# Patient Record
Sex: Female | Born: 1985 | Race: White | Hispanic: No | Marital: Single | Smoking: Current every day smoker
Health system: Southern US, Community
[De-identification: ages and names within clinical notes are randomized; demographics above are authoritative.]

## PROBLEM LIST (undated history)

## (undated) DIAGNOSIS — Z8489 Family history of other specified conditions: Secondary | ICD-10-CM

## (undated) DIAGNOSIS — N939 Abnormal uterine and vaginal bleeding, unspecified: Secondary | ICD-10-CM

## (undated) DIAGNOSIS — N920 Excessive and frequent menstruation with regular cycle: Secondary | ICD-10-CM

## (undated) HISTORY — PX: TUBAL LIGATION: SHX77

## (undated) HISTORY — PX: IUD REMOVAL: SHX5392

---

## 2005-01-22 ENCOUNTER — Emergency Department: Payer: Self-pay | Admitting: Emergency Medicine

## 2005-02-23 ENCOUNTER — Emergency Department: Payer: Self-pay | Admitting: Emergency Medicine

## 2005-12-14 ENCOUNTER — Inpatient Hospital Stay: Payer: Self-pay | Admitting: Unknown Physician Specialty

## 2006-10-18 ENCOUNTER — Emergency Department: Payer: Self-pay | Admitting: Emergency Medicine

## 2009-03-02 ENCOUNTER — Inpatient Hospital Stay: Payer: Self-pay | Admitting: Internal Medicine

## 2011-05-07 ENCOUNTER — Emergency Department: Payer: Self-pay | Admitting: Internal Medicine

## 2011-06-06 ENCOUNTER — Emergency Department: Payer: Self-pay | Admitting: Emergency Medicine

## 2013-04-16 IMAGING — CT CT HEAD WITHOUT CONTRAST
1 series · 16 of 30 positions shown, 20 images · non-contrast
Comparison: none

REASON FOR EXAM: ha
COMMENTS:

PROCEDURE:     CT  - CT HEAD WITHOUT CONTRAST  - May 07, 2011 [DATE]
RESULT:     Comparison:  None
TECHNIQUE: Multiple axial images from the foramen magnum to the vertex were
obtained without IV contrast.

[Series 2: soft tissue · axial · 0.44mm/px · z∈[-152,-17]mm · 16 of 31 slices shown, 20 images]
[im 2/31  brain]
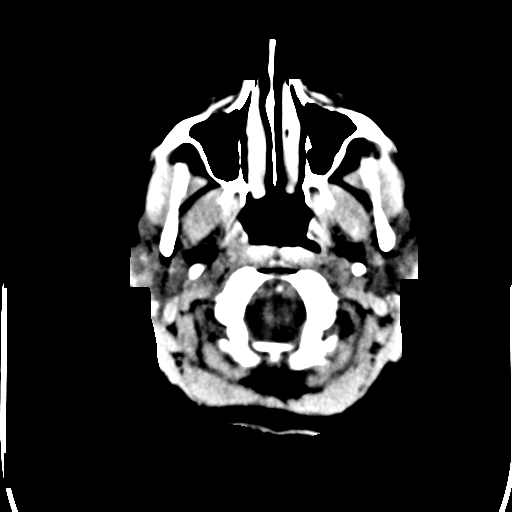
[im 2/31  bone]
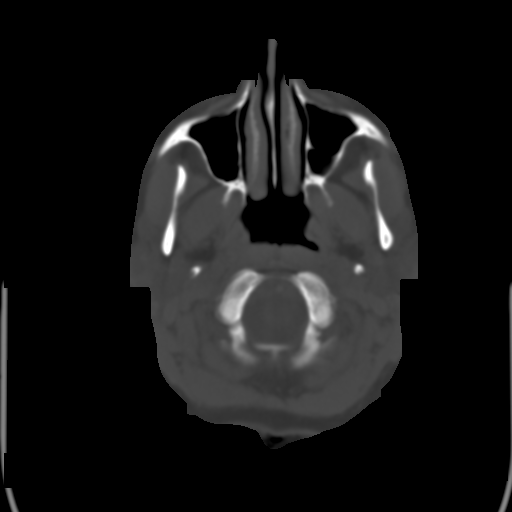
[im 4/31  brain]
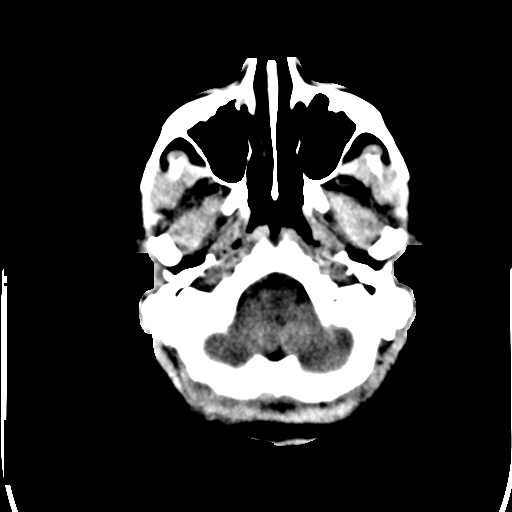
[im 6/31  brain]
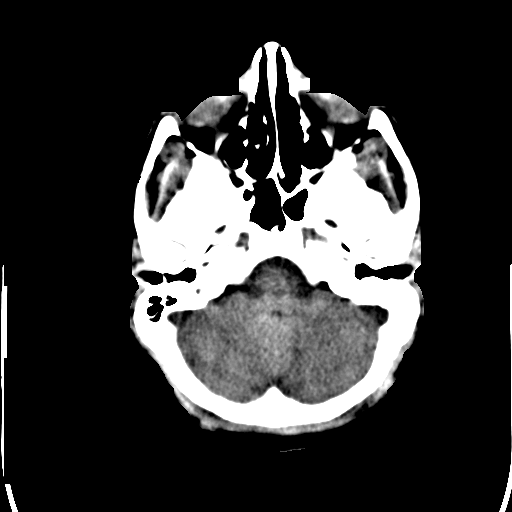
[im 8/31  brain]
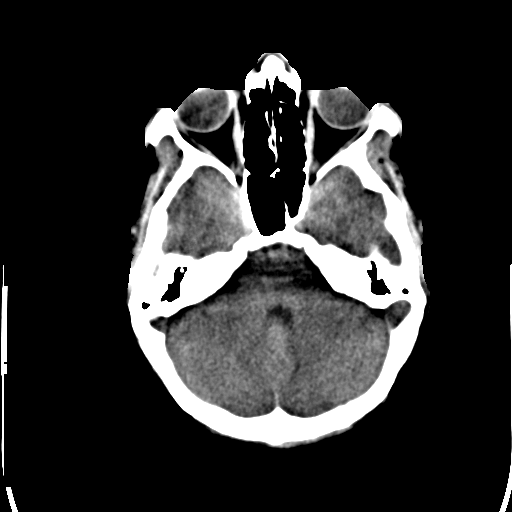
[im 9/31  brain]
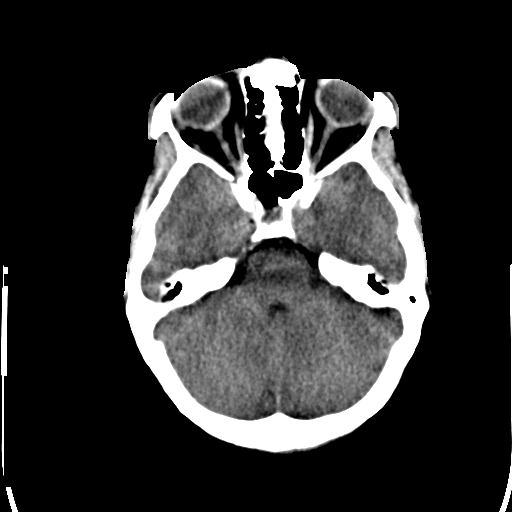
[im 9/31  bone]
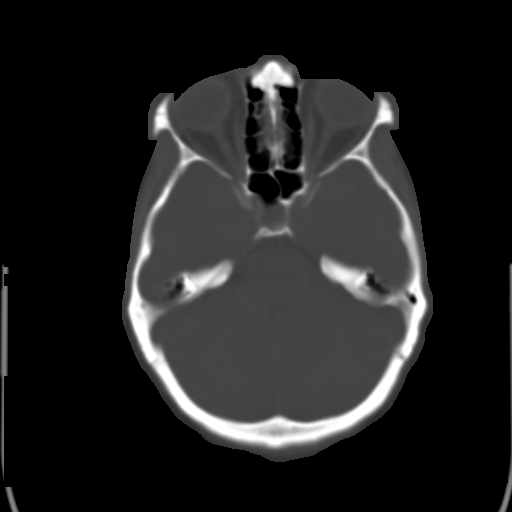
[im 11/31  brain]
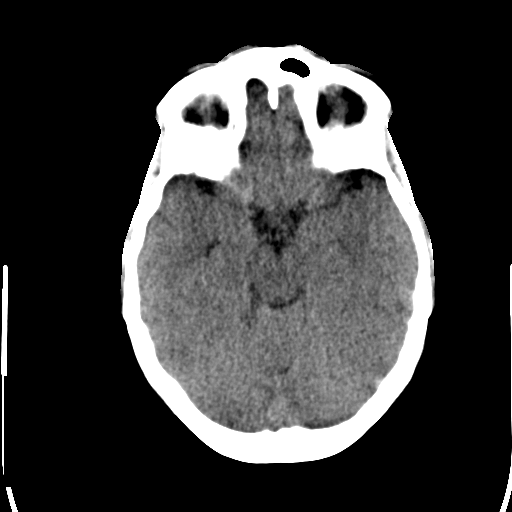
[im 13/31  brain]
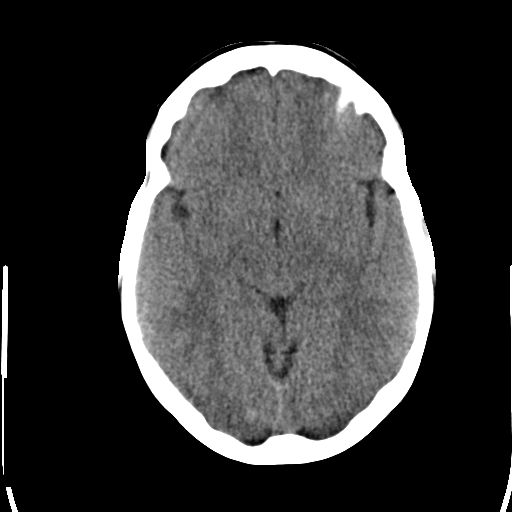
[im 15/31  brain]
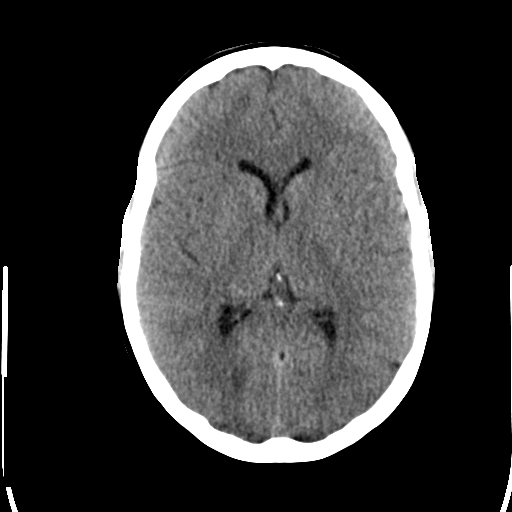
[im 16/31  brain]
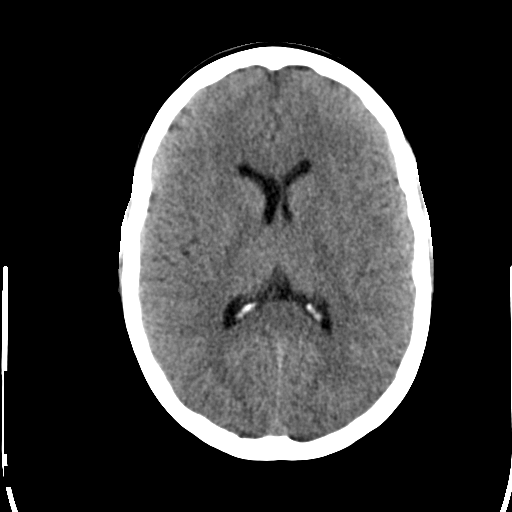
[im 16/31  bone]
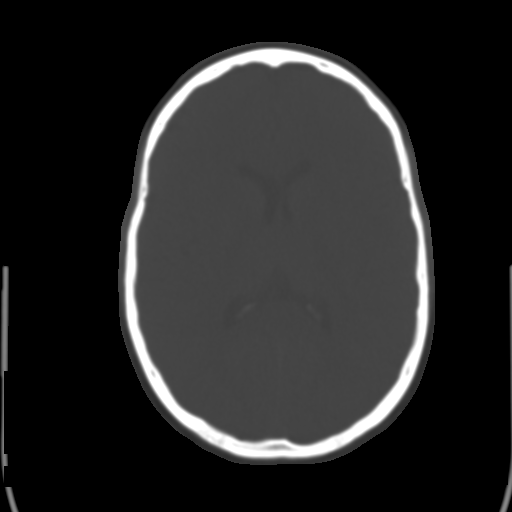
[im 18/31  brain]
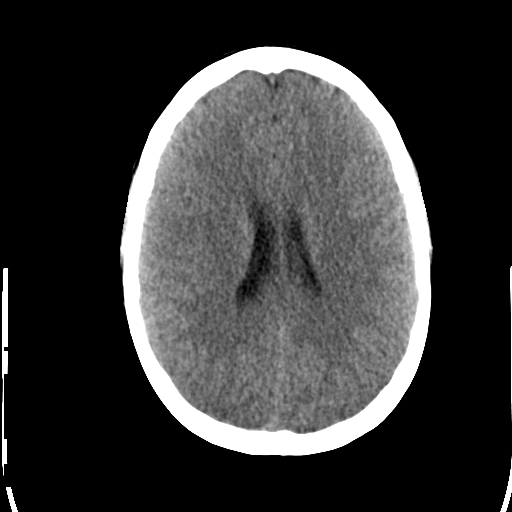
[im 20/31  brain]
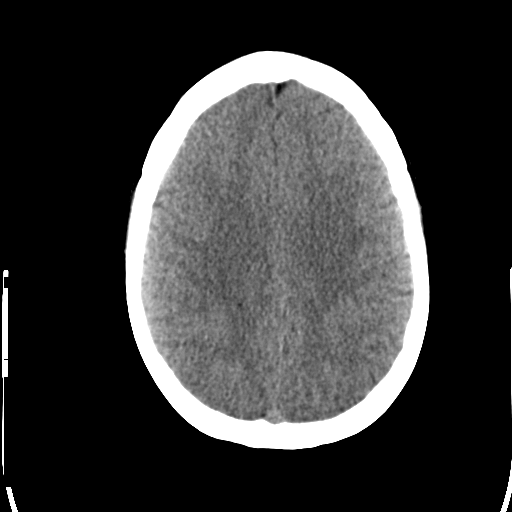
[im 22/31  brain]
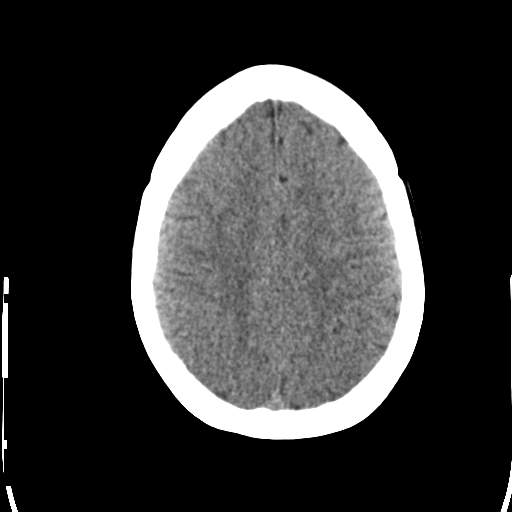
[im 23/31  brain]
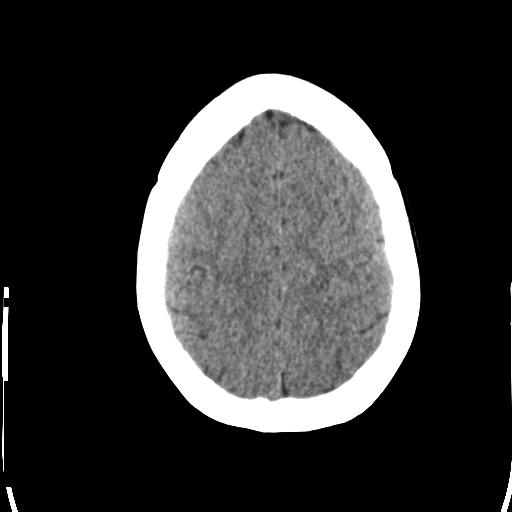
[im 23/31  bone]
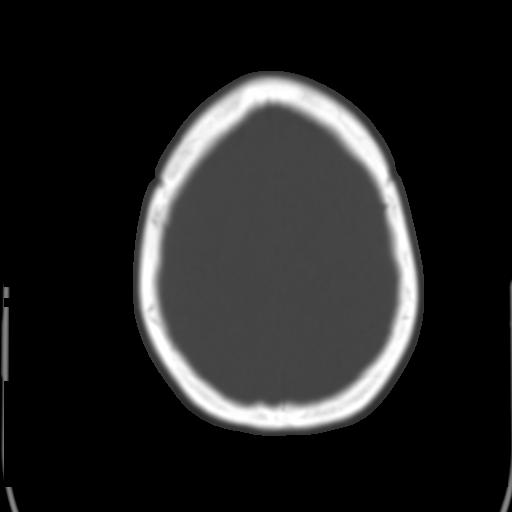
[im 25/31  brain]
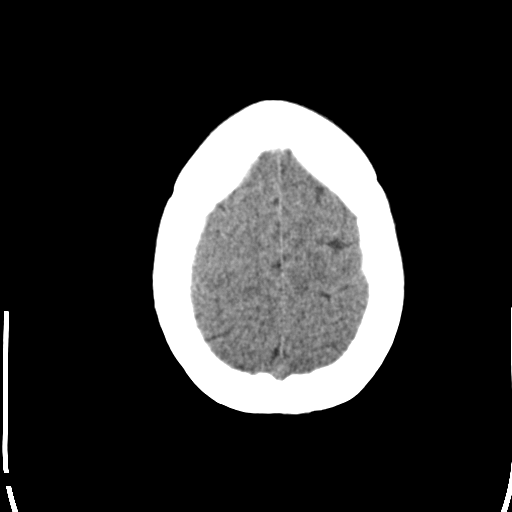
[im 27/31  brain]
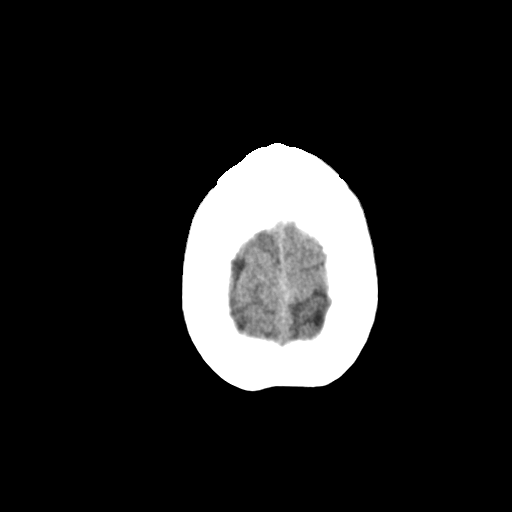
[im 29/31  brain]
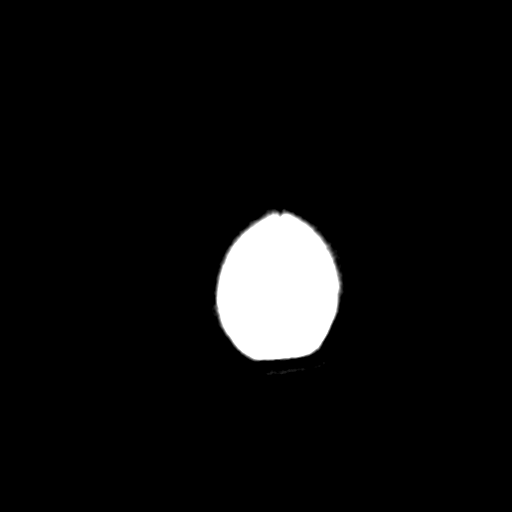

[16 of 30 positions shown; findings below may reference images not displayed]

FINDINGS: There is no evidence of mass effect, midline shift, or extra-axial fluid
collections.  There is no evidence of a space-occupying lesion or
intracranial hemorrhage. There is no evidence of a cortical-based area of
acute infarction.

The ventricles and sulci are appropriate for the patient's age. The basal
cisterns are patent.

Visualized portions of the orbits are unremarkable. The visualized portions
of the paranasal sinuses and mastoid air cells are unremarkable.

The osseous structures are unremarkable.
IMPRESSION: No acute intracranial process.

## 2014-05-09 ENCOUNTER — Emergency Department: Payer: Self-pay | Admitting: Student

## 2015-08-08 HISTORY — PX: INTRAUTERINE DEVICE (IUD) INSERTION: SHX5877

## 2015-09-25 ENCOUNTER — Encounter
Admission: RE | Admit: 2015-09-25 | Discharge: 2015-09-25 | Disposition: A | Discharge status: Home / Self Care | Payer: Managed Care, Other (non HMO) | Source: Ambulatory Visit | Attending: Obstetrics and Gynecology | Admitting: Obstetrics and Gynecology

## 2015-09-25 DIAGNOSIS — Z01812 Encounter for preprocedural laboratory examination: Secondary | ICD-10-CM | POA: Insufficient documentation

## 2015-09-25 HISTORY — DX: Family history of other specified conditions: Z84.89

## 2015-09-25 HISTORY — DX: Abnormal uterine and vaginal bleeding, unspecified: N93.9

## 2015-09-25 HISTORY — DX: Excessive and frequent menstruation with regular cycle: N92.0

## 2015-09-25 LAB — BASIC METABOLIC PANEL
Anion gap: 8 (ref 5–15)
BUN: 13 mg/dL (ref 6–20)
CHLORIDE: 103 mmol/L (ref 101–111)
CO2: 26 mmol/L (ref 22–32)
CREATININE: 0.74 mg/dL (ref 0.44–1.00)
Calcium: 9.3 mg/dL (ref 8.9–10.3)
GFR calc Af Amer: >60 mL/min (ref >60)
GFR calc non Af Amer: >60 mL/min (ref >60)
GLUCOSE: 82 mg/dL (ref 65–99)
Potassium: 3.6 mmol/L (ref 3.5–5.1)
SODIUM: 137 mmol/L (ref 135–145)

## 2015-09-25 LAB — CBC
HCT: 36.4 % (ref 35.0–47.0)
Hemoglobin: 12.2 g/dL (ref 12.0–16.0)
MCH: 27.5 pg (ref 26.0–34.0)
MCHC: 33.5 g/dL (ref 32.0–36.0)
MCV: 82.1 fL (ref 80.0–100.0)
PLATELETS: 342 10*3/uL (ref 150–440)
RBC: 4.43 MIL/uL (ref 3.80–5.20)
RDW: 15.3 % — AB (ref 11.5–14.5)
WBC: 9.5 10*3/uL (ref 3.6–11.0)

## 2015-09-25 LAB — TYPE AND SCREEN
ABO/RH(D): A POS
Antibody Screen: NEGATIVE

## 2015-09-25 LAB — ABO/RH: ABO/RH(D): A POS

## 2015-09-25 NOTE — H&P (Signed)
Ms. Krogh is a 30 y.o. female here for Pre Op Consulting .  AUB- heavy menometrorrhagia since menarche, worsening recently, currently 1 tampon q2-3hrs for months.  We have tried: OCPs from age [age] and cycles became regular with Menorrhagia. Subsquently has tried Nuvaring and the patch for bleeding without relief. S/p BTL. I trialed Mirena IUD without improvement in bleeding.   Hx of known fibroids, +easy bruising - von willebrand's disease negative, normal PT/INR PTT, normal TSH, normal CBC, normal prolactin (mildly high at 25 and no repeat yet).  Acute pelvic pain: Increased in last several months and today has reached a crescendo that caused her to leave work early. Usually on the left side and described as cramping, sharp, terrible. Today in midline but deep in pelvis. Pain not resolved with IUD.  Ultrasound 07/10/15: Ut wnl, but endometrial thickness is significant at 17.64mm bil ovs wnl- both enlarged with R volulme of 12.6cm3 and left volume of 13.2cm3.  Free fluid seen ant cul de sac  Hx of ovarian cancer in her maternal grandmother, mother, and sister.   Past Medical History:  has a past medical history of Dysmenorrhea; High risk HPV infection; Menorrhagia; and Unspecified dyspareunia.  Past Surgical History:  has a past surgical history that includes Tubal ligation. Family History: family history includes Cervical cancer in her maternal grandmother; No Known Problems in her father; Ovarian cancer in her maternal grandmother, mother, and sister. She was adopted. Social History:  reports that she has been smoking. She has a 7.50 pack-year smoking history. She has never used smokeless tobacco. She reports that she does not drink alcohol or use illicit drugs. OB/GYN History:  OB History    Gravida Para Term Preterm AB TAB SAB Ectopic Multiple Living   Allergies: is allergic to penicillin. Medications:  Current Outpatient Prescriptions:  .  ibuprofen (ADVIL,MOTRIN) 800 MG tablet, Take 1 tablet (800 mg total) by mouth every 8 (eight) hours as needed for Pain., Disp: 60 tablet, Rfl: 1 . norethindrone-ethinyl estradiol (ORTHO-NOVUM, NORTREL) 1-35 mg-mcg tablet, Take 1 tab q6hrs for 4 days, then q8 hours for 3 days, then q12 hrs for 2 days, then continue regular pack (1 pill daily)., Disp: 1 Package, Rfl: 6 . oxyCODONE-acetaminophen (PERCOCET) 5-325 mg tablet, Take 1 tablet by mouth every 6 (six) hours as needed for Pain., Disp: 30 tablet, Rfl: 0   Review of Systems: General:   No fatigue or weight loss Eyes:   No vision changes Ears:   No hearing difficulty Respiratory:   No cough or shortness of breath Pulmonary:   No asthma or shortness of breath Cardiovascular:  No chest pain, palpitations, dyspnea on exertion Gastrointestinal:  No abdominal bloating, chronic diarrhea, constipations, masses, pain or hematochezia Genitourinary:  No hematuria, dysuria, abnormal vaginal discharge, + pelvic pain, + Menometrorrhagia  Lymphatic:  No swollen lymph nodes Musculoskeletal: No muscle weakness Neurologic:  No extremity weakness, syncope, seizure disorder Psychiatric:  No history of depression, delusions or suicidal/homicidal ideation   Exam:      Vitals:   09/24/15 1557  BP: 133/86  Pulse: 109   Body mass index is 23.1 kg/(m^2).  WDWN white female in NAD Lungs: CTA  CV : RRR without murmur  Breast: deferred Neck: no thyromegaly Abdomen: soft , no mass, normal active bowel sounds, non-tender, no rebound tenderness Skin: No rashes, ulcers or skin lesions noted. No excessive hirsutism or acne noted.  Neurological: Appears alert and oriented and is a good historian. No gross abnormalities are noted. Psychological: Normal affect and mood. No signs of anxiety or depression noted.   Pelvic:  Deferred    Impression:   The primary encounter diagnosis was Abnormal uterine bleeding (AUB). Diagnoses of  Menometrorrhagia and Pelvic pain in female were also pertinent to this visit.    Plan:   - Acute on pelvic pain: Pain is significantly limiting. Temporarily manage pain with Percocet #30 tabs given. Continue  motrin. Smoker. ?adenomyosis. Will need genetic consult if proceed with hysterectomy.  -  AUB- unresolved with multiple methods of hormone treatment. Tissue sample required. EMBx not performed as will both cause her pain and not change my course of therapy. Will plan for:   D&C, hysteroscopy and dx laparoscopy: Patient returns for a preoperative discussion regarding her plans to proceed with surgical treatment of her acutely worsening pelvic pain and AUB by above procedure. Hopefully she will have some relief temporarily from bleeding, and we can rule out malignancy.  The patient and I discussed the technical aspects of the procedure including the potential for risks and complications. These include but are not limited to the risk of infection requiring post-operative antibiotics or further procedures. We talked about the risk of injury to adjacent organs including bladder, bowel, ureter, blood vessels or nerves. We talked about the need to convert to an open incision. We talked about the possible need for blood transfusion. We talked aboutpostop complications such asthromboembolic or cardiopulmonary complications. All of her questions were answered. Her preoperative exam was completed and the appropriate consents were signed. She is scheduled to undergo this procedure in the near future.  - Return in about 2 weeks (around 10/08/2015) for Postop check.  Cecilie Kicks, MD

## 2015-09-25 NOTE — Patient Instructions (Signed)
  Your procedure is scheduled on: Monday Feb. 6, 2017. Report to Same Day Surgery. To find out your arrival time please call [phone] between 1PM - 3PM on Friday Feb. 3, 2017.  Remember: Instructions that are not followed completely may result in serious medical risk, up to and including death, or upon the discretion of your surgeon and anesthesiologist your surgery may need to be rescheduled.    _x___ 1. Do not eat food or drink liquids after midnight. No gum chewing or hard candies.     ____ 2. No Alcohol for 24 hours before or after surgery.   ____ 3. Bring all medications with you on the day of surgery if instructed.    __x__ 4. Notify your doctor if there is any change in your medical condition     (cold, fever, infections).     Do not wear jewelry, make-up, hairpins, clips or nail polish.  Do not wear lotions, powders, or perfumes. You may wear deodorant.  Do not shave 48 hours prior to surgery. Men may shave face and neck.  Do not bring valuables to the hospital.    [facility] is not responsible for any belongings or valuables.               Contacts, dentures or bridgework may not be worn into surgery.  Leave your suitcase in the car. After surgery it may be brought to your room.  For patients admitted to the hospital, discharge time is determined by your treatment team.   Patients discharged the day of surgery will not be allowed to drive home.    Please read over the following fact sheets that you were given:   [facility] Preparing for Surgery  _x___ Take these medicines the morning of surgery with A SIP OF WATER: none   1. Oxycodone/tylenol optional   ____ Fleet Enema (as directed)   __x__ Use CHG Soap as directed  ____ Use inhalers on the day of surgery  ____ Stop metformin 2 days prior to surgery    ____ Take 1/2 of usual insulin dose the night before surgery and none on the morning of surgery.   ____ Stop Coumadin/Plavix/aspirin on does not  apply.  __x__ Stop Anti-inflammatories now Motrin, advil, aleve, naproxen, OK to take Oxycodone or tylenol.   ____ Stop supplements until after surgery.    ____ Bring C-Pap to the hospital.

## 2015-09-29 ENCOUNTER — Ambulatory Visit
Admission: RE | Admit: 2015-09-29 | Discharge: 2015-09-29 | Disposition: A | Discharge status: Home / Self Care | Payer: Managed Care, Other (non HMO) | Source: Ambulatory Visit | Attending: Obstetrics and Gynecology | Admitting: Obstetrics and Gynecology

## 2015-09-29 ENCOUNTER — Encounter: Payer: Self-pay | Admitting: *Deleted

## 2015-09-29 ENCOUNTER — Ambulatory Visit: Payer: Managed Care, Other (non HMO) | Admitting: Anesthesiology

## 2015-09-29 ENCOUNTER — Encounter
Admission: RE | Disposition: A | Discharge status: Home / Self Care | Payer: Self-pay | Source: Ambulatory Visit | Attending: Obstetrics and Gynecology

## 2015-09-29 DIAGNOSIS — R102 Pelvic and perineal pain: Secondary | ICD-10-CM | POA: Insufficient documentation

## 2015-09-29 DIAGNOSIS — Z9851 Tubal ligation status: Secondary | ICD-10-CM | POA: Insufficient documentation

## 2015-09-29 DIAGNOSIS — Z880 Allergy status to penicillin: Secondary | ICD-10-CM | POA: Insufficient documentation

## 2015-09-29 DIAGNOSIS — Z793 Long term (current) use of hormonal contraceptives: Secondary | ICD-10-CM | POA: Insufficient documentation

## 2015-09-29 DIAGNOSIS — N840 Polyp of corpus uteri: Secondary | ICD-10-CM | POA: Insufficient documentation

## 2015-09-29 DIAGNOSIS — F17200 Nicotine dependence, unspecified, uncomplicated: Secondary | ICD-10-CM | POA: Insufficient documentation

## 2015-09-29 DIAGNOSIS — Z791 Long term (current) use of non-steroidal anti-inflammatories (NSAID): Secondary | ICD-10-CM | POA: Diagnosis not present

## 2015-09-29 DIAGNOSIS — N921 Excessive and frequent menstruation with irregular cycle: Secondary | ICD-10-CM | POA: Diagnosis not present

## 2015-09-29 DIAGNOSIS — N736 Female pelvic peritoneal adhesions (postinfective): Secondary | ICD-10-CM | POA: Insufficient documentation

## 2015-09-29 DIAGNOSIS — Z79891 Long term (current) use of opiate analgesic: Secondary | ICD-10-CM | POA: Diagnosis not present

## 2015-09-29 DIAGNOSIS — G8929 Other chronic pain: Secondary | ICD-10-CM | POA: Insufficient documentation

## 2015-09-29 DIAGNOSIS — N800 Endometriosis of uterus: Secondary | ICD-10-CM | POA: Diagnosis not present

## 2015-09-29 DIAGNOSIS — Z8049 Family history of malignant neoplasm of other genital organs: Secondary | ICD-10-CM | POA: Diagnosis not present

## 2015-09-29 DIAGNOSIS — N939 Abnormal uterine and vaginal bleeding, unspecified: Secondary | ICD-10-CM | POA: Diagnosis present

## 2015-09-29 HISTORY — PX: LAPAROSCOPIC LYSIS OF ADHESIONS: SHX5905

## 2015-09-29 HISTORY — PX: LAPAROSCOPY: SHX197

## 2015-09-29 HISTORY — PX: HYSTEROSCOPY WITH D & C: SHX1775

## 2015-09-29 LAB — POCT PREGNANCY, URINE: PREG TEST UR: NEGATIVE

## 2015-09-29 SURGERY — DILATATION AND CURETTAGE /HYSTEROSCOPY
Anesthesia: General | Site: Abdomen | Wound class: Clean Contaminated

## 2015-09-29 MED ORDER — BUPIVACAINE HCL (PF) 0.5 % IJ SOLN
INTRAMUSCULAR | Status: AC
  Filled 2015-09-29: qty 30

## 2015-09-29 MED ORDER — FAMOTIDINE 20 MG PO TABS
ORAL_TABLET | ORAL | Status: DC
Start: 2015-09-29 — End: 2015-09-29
  Filled 2015-09-29: qty 1

## 2015-09-29 MED ORDER — KETOROLAC TROMETHAMINE 30 MG/ML IJ SOLN
30.0000 mg | Freq: Once | INTRAMUSCULAR | Status: AC
Start: 2015-09-29 — End: 2015-09-29
  Administered 2015-09-29: 30 mg via INTRAVENOUS

## 2015-09-29 MED ORDER — LACTATED RINGERS IR SOLN
Status: DC | PRN
  Administered 2015-09-29 (×2): 1000 mL

## 2015-09-29 MED ORDER — ROCURONIUM BROMIDE 100 MG/10ML IV SOLN
INTRAVENOUS | Status: DC | PRN
  Administered 2015-09-29: 10 mg via INTRAVENOUS

## 2015-09-29 MED ORDER — ONDANSETRON HCL 4 MG/2ML IJ SOLN
INTRAMUSCULAR | Status: DC | PRN
  Administered 2015-09-29: 4 mg via INTRAVENOUS

## 2015-09-29 MED ORDER — FAMOTIDINE 20 MG PO TABS
20.0000 mg | ORAL_TABLET | Freq: Once | ORAL | Status: AC
  Administered 2015-09-29: 20 mg via ORAL

## 2015-09-29 MED ORDER — DEXAMETHASONE SODIUM PHOSPHATE 10 MG/ML IJ SOLN
INTRAMUSCULAR | Status: DC | PRN
  Administered 2015-09-29: 5 mg via INTRAVENOUS

## 2015-09-29 MED ORDER — SUCCINYLCHOLINE CHLORIDE 20 MG/ML IJ SOLN
INTRAMUSCULAR | Status: DC | PRN
  Administered 2015-09-29: 80 mg via INTRAVENOUS

## 2015-09-29 MED ORDER — METHYLENE BLUE 1 % INJ SOLN
INTRAMUSCULAR | Status: AC
  Filled 2015-09-29: qty 10

## 2015-09-29 MED ORDER — LACTATED RINGERS IV SOLN
INTRAVENOUS | Status: DC
  Administered 2015-09-29: 08:00:00 via INTRAVENOUS

## 2015-09-29 MED ORDER — FENTANYL CITRATE (PF) 100 MCG/2ML IJ SOLN
INTRAMUSCULAR | Status: AC
  Administered 2015-09-29: 25 ug via INTRAVENOUS
  Filled 2015-09-29: qty 2

## 2015-09-29 MED ORDER — ONDANSETRON HCL 4 MG/2ML IJ SOLN: 4.0000 mg | Freq: Once | INTRAMUSCULAR | Status: DC | PRN

## 2015-09-29 MED ORDER — LIDOCAINE HCL 1 % IJ SOLN
INTRAMUSCULAR | Status: DC | PRN
  Administered 2015-09-29: 8 mL

## 2015-09-29 MED ORDER — FENTANYL CITRATE (PF) 100 MCG/2ML IJ SOLN
25.0000 ug | INTRAMUSCULAR | Status: DC | PRN
  Administered 2015-09-29 (×2): 25 ug via INTRAVENOUS

## 2015-09-29 MED ORDER — LIDOCAINE HCL (CARDIAC) 20 MG/ML IV SOLN
INTRAVENOUS | Status: DC | PRN
  Administered 2015-09-29: 80 mg via INTRAVENOUS

## 2015-09-29 MED ORDER — IBUPROFEN 800 MG PO TABS: 800.0000 mg | ORAL_TABLET | Freq: Three times a day (TID) | ORAL | Status: AC | PRN

## 2015-09-29 MED ORDER — FENTANYL CITRATE (PF) 100 MCG/2ML IJ SOLN
INTRAMUSCULAR | Status: DC | PRN
  Administered 2015-09-29 (×3): 50 ug via INTRAVENOUS
  Administered 2015-09-29 (×2): 25 ug via INTRAVENOUS

## 2015-09-29 MED ORDER — PROPOFOL 10 MG/ML IV BOLUS
INTRAVENOUS | Status: DC | PRN
  Administered 2015-09-29: 150 mg via INTRAVENOUS

## 2015-09-29 MED ORDER — OXYCODONE-ACETAMINOPHEN 5-325 MG PO TABS: 1.0000 | ORAL_TABLET | Freq: Four times a day (QID) | ORAL | Status: DC | PRN

## 2015-09-29 MED ORDER — KETOROLAC TROMETHAMINE 30 MG/ML IJ SOLN
INTRAMUSCULAR | Status: AC
  Administered 2015-09-29: 30 mg via INTRAVENOUS
  Filled 2015-09-29: qty 1

## 2015-09-29 MED ORDER — LIDOCAINE HCL (PF) 1 % IJ SOLN
INTRAMUSCULAR | Status: AC
  Filled 2015-09-29: qty 30

## 2015-09-29 MED ORDER — SILVER NITRATE-POT NITRATE 75-25 % EX MISC
CUTANEOUS | Status: AC
  Filled 2015-09-29: qty 4

## 2015-09-29 MED ORDER — DOCUSATE SODIUM 100 MG PO CAPS: 100.0000 mg | ORAL_CAPSULE | Freq: Two times a day (BID) | ORAL | Status: AC | PRN

## 2015-09-29 MED ORDER — MIDAZOLAM HCL 2 MG/2ML IJ SOLN
INTRAMUSCULAR | Status: DC | PRN
  Administered 2015-09-29: 2 mg via INTRAVENOUS

## 2015-09-29 SURGICAL SUPPLY — 57 items
BAG URO DRAIN 2000ML W/SPOUT (MISCELLANEOUS) ×4 IMPLANT
BARRIER ADHS 3X4 INTERCEED (GAUZE/BANDAGES/DRESSINGS) ×4 IMPLANT
BLADE SURG SZ11 CARB STEEL (BLADE) ×4 IMPLANT
CANISTER SUCT 3000ML (MISCELLANEOUS) ×4 IMPLANT
CATH FOLEY 2WAY  5CC 16FR (CATHETERS) ×2
CATH ROBINSON RED A/P 16FR (CATHETERS) ×4 IMPLANT
CATH URTH 16FR FL 2W BLN LF (CATHETERS) ×2 IMPLANT
CHLORAPREP W/TINT 26ML (MISCELLANEOUS) ×4 IMPLANT
CLOSURE WOUND 1/2 X4 (GAUZE/BANDAGES/DRESSINGS) ×1
CLOSURE WOUND 1/4X4 (GAUZE/BANDAGES/DRESSINGS) ×1
CORD URO TURP 10FT (MISCELLANEOUS) ×4 IMPLANT
DRESSING TELFA 4X3 1S ST N-ADH (GAUZE/BANDAGES/DRESSINGS) ×4 IMPLANT
DRSG TEGADERM 2-3/8X2-3/4 SM (GAUZE/BANDAGES/DRESSINGS) ×4 IMPLANT
ELECT LOOP MED HF 24F 12D (CUTTING LOOP) ×4 IMPLANT
ELECT REM PT RETURN 9FT ADLT (ELECTROSURGICAL) ×4
ELECT RESECT POWERBALL 24F (MISCELLANEOUS) ×4 IMPLANT
ELECTRODE REM PT RTRN 9FT ADLT (ELECTROSURGICAL) ×2 IMPLANT
ENDOPOUCH RETRIEVER 10 (MISCELLANEOUS) ×4 IMPLANT
GAUZE SPONGE NON-WVN 2X2 STRL (MISCELLANEOUS) ×2 IMPLANT
GLOVE BIO SURGEON STRL SZ 6.5 (GLOVE) ×12 IMPLANT
GLOVE BIO SURGEON STRL SZ7.5 (GLOVE) ×4 IMPLANT
GLOVE BIO SURGEONS STRL SZ 6.5 (GLOVE) ×4
GLOVE BIOGEL PI IND STRL 8 (GLOVE) ×2 IMPLANT
GLOVE BIOGEL PI INDICATOR 8 (GLOVE) ×2
GLOVE INDICATOR 7.0 STRL GRN (GLOVE) ×8 IMPLANT
GOWN STRL REUS W/ TWL LRG LVL3 (GOWN DISPOSABLE) ×4 IMPLANT
GOWN STRL REUS W/TWL LRG LVL3 (GOWN DISPOSABLE) ×4
IRRIGATION STRYKERFLOW (MISCELLANEOUS) ×2 IMPLANT
IRRIGATOR STRYKERFLOW (MISCELLANEOUS) ×4
IV LACTATED RINGERS 1000ML (IV SOLUTION) ×4 IMPLANT
IV SOD CHL 0.9% 1000ML (IV SOLUTION) ×4 IMPLANT
KIT RM TURNOVER CYSTO AR (KITS) ×4 IMPLANT
LABEL OR SOLS (LABEL) ×4 IMPLANT
LIGASURE 5MM LAPAROSCOPIC (INSTRUMENTS) ×4 IMPLANT
LIQUID BAND (GAUZE/BANDAGES/DRESSINGS) ×4 IMPLANT
NS IRRIG 500ML POUR BTL (IV SOLUTION) ×4 IMPLANT
PACK DNC HYST (MISCELLANEOUS) ×4 IMPLANT
PACK GYN LAPAROSCOPIC (MISCELLANEOUS) ×4 IMPLANT
PAD OB MATERNITY 4.3X12.25 (PERSONAL CARE ITEMS) ×4 IMPLANT
PAD PREP 24X41 OB/GYN DISP (PERSONAL CARE ITEMS) ×4 IMPLANT
SCISSORS METZENBAUM CVD 33 (INSTRUMENTS) ×8 IMPLANT
SET CYSTO W/LG BORE CLAMP LF (SET/KITS/TRAYS/PACK) ×4 IMPLANT
SLEEVE ENDOPATH XCEL 5M (ENDOMECHANICALS) ×4 IMPLANT
SPONGE VERSALON 2X2 STRL (MISCELLANEOUS) ×2
STRIP CLOSURE SKIN 1/2X4 (GAUZE/BANDAGES/DRESSINGS) ×3 IMPLANT
STRIP CLOSURE SKIN 1/4X4 (GAUZE/BANDAGES/DRESSINGS) ×3 IMPLANT
SUT MNCRL AB 4-0 PS2 18 (SUTURE) ×8 IMPLANT
SUT VIC AB 2-0 UR6 27 (SUTURE) ×4 IMPLANT
SUT VIC AB 4-0 SH 27 (SUTURE) ×2
SUT VIC AB 4-0 SH 27XANBCTRL (SUTURE) ×2 IMPLANT
SWABSTK COMLB BENZOIN TINCTURE (MISCELLANEOUS) ×4 IMPLANT
TROCAR ENDO BLADELESS 11MM (ENDOMECHANICALS) ×4 IMPLANT
TROCAR XCEL NON-BLD 5MMX100MML (ENDOMECHANICALS) ×8 IMPLANT
TROCAR XCEL UNIV SLVE 11M 100M (ENDOMECHANICALS) ×4 IMPLANT
TUBING CONNECTING 10 (TUBING) ×3 IMPLANT
TUBING CONNECTING 10' (TUBING) ×1
TUBING INSUFFLATOR HI FLOW (MISCELLANEOUS) ×4 IMPLANT

## 2015-09-29 NOTE — Addendum Note (Signed)
Addendum  created 09/29/15 1354 by Jannet Mantis, CRNA   Modules edited: Charges VN

## 2015-09-29 NOTE — Op Note (Signed)
Tina Mills PROCEDURE DATE: 09/29/2015  PREOPERATIVE DIAGNOSIS: Abnormal uterine bleeding, Chronic pelvic pain POSTOPERATIVE DIAGNOSIS: Endometrial polyp, Endometriosis, pelvic adhesions PROCEDURE: Exam under anesthesia, removal of IUD, Fractional D&C, Hysteroscopy, Diagnostic laparoscopy with peritoneal biopsies, destruction of endometriosis lesions, and lysis of adhesions for >50% of the case SURGEON:  Dr. [name] ANESTHESIOLOGIST: Lezlie Octave, MD Anesthesiologist: Lezlie Octave, MD CRNA: Jannet Mantis, CRNA  INDICATIONS: 30 y.o. with a hx of BTL and with history of significant menorrhagia and acute on chronic pelvic pain desiring surgical evaluation.   Please see preoperative notes for further details.  Risks of surgery were discussed with the patient including but not limited to: bleeding which may require transfusion or reoperation; infection which may require antibiotics; injury to bowel, bladder, ureters or other surrounding organs; need for additional procedures including laparotomy; thromboembolic phenomenon, incisional problems and other postoperative/anesthesia complications. Written informed consent was obtained.    FINDINGS:  Large patulous cervix with significant proliferative endometrial tissue and apparent endometrial polyps. Small uterus, normal ovaries and fallopian tubes bilaterally. Evidence of prior tubal ligation noted. There is diffuse evidence of purple and red-colored endometriosis lesions in the posterior cul-de-sac, left pelvic sidewall and left uterosacral ligament, and right ovary.  Peritoneal biopsies were taken and sent to pathology. No Allen-Masters windows noted.  The left descending colon into the left pelvis was adherent to the left lower quadrant peritoneum.   No other abdominal/pelvic abnormality.  Normal upper abdomen.  ANESTHESIA:    General INTRAVENOUS FLUIDS: 1000 ml ESTIMATED BLOOD LOSS: 50 ml URINE OUTPUT: 0  ml SPECIMENS: Endocervical curettage, endometrial curettage, Peritoneal biopsies on left ureter/sidewall and left uterosacral ligament. COMPLICATIONS: None immediate  PROCEDURE IN DETAIL:  The patient had sequential compression devices applied to her lower extremities while in the preoperative area.  She was then taken to the operating room where general anesthesia was administered and was found to be adequate.  She was placed in the dorsal lithotomy position, and was prepped and draped in a sterile manner.    Her bladder was catheterized but no urine was expelled. A weighed speculum was then placed in the patient's vagina and a single tooth tenaculum was applied to the anterior lip of the cervix. The Mirena IUD was removed intact and without difficulty.   An endocervical currettage was performed. Her cervix was serially dilated to 15 [place] using Hanks dilators.The hysteroscope was introduced to reveal the above findings. The uterine cavity was carefully examined, the left ostia was recognized but the right was obscured with fluffy tissue, and diffusely proliferative endometrium with polypoid fragments was noted.  A sharp curettage was then performed until there was a gritty texture in all four quadrants. A uterine manipulator was placed without difficulty.  Attention was turned to the abdomen where an umbilical incision was made with the scalpel.  The Optiview 11-mm trocar and sleeve were then advanced without difficulty with the laparoscope under direct visualization into the abdomen. The abdomen was then insufflated with carbon dioxide gas and adequate pneumoperitoneum was obtained.   A detailed survey of the patient's pelvis and abdomen revealed the findings as mentioned above.   Biopsy forceps were used to take peritoneal biopsies.  The operative site was surveyed, and it was found to be hemostatic.    Attention was turned to the left lower quadrant adhesions, and these were sharply taken down,  being care to avoid the bowel wall. Interceed adhesion barrier was placed on the side, which was hemostatic.  No intraoperative injury to surrounding organs was noted.  Pictures were taken of the quadrants and pelvis. The abdomen was desufflated and all instruments were then removed from the patient's abdomen. The uterine manipulator was removed without complications.  All incisions were closed with 4-0 Vicryl and Dermabond.   The patient tolerated the procedures well, though she coughed a lot.  All instruments, needles, and sponge counts were correct x 2. The patient was taken to the recovery room in stable condition.

## 2015-09-29 NOTE — Anesthesia Procedure Notes (Signed)
Procedure Name: Intubation Date/Time: 09/29/2015 10:03 AM Performed by: Jannet Mantis Pre-anesthesia Checklist: Patient identified, Emergency Drugs available, Suction available and Patient being monitored Patient Re-evaluated:Patient Re-evaluated prior to inductionOxygen Delivery Method: Circle system utilized Preoxygenation: Pre-oxygenation with 100% oxygen Intubation Type: IV induction Ventilation: Mask ventilation without difficulty Laryngoscope Size: Mac and 3 Grade View: Grade I Tube type: Oral Tube size: 7.0 mm Number of attempts: 1 Placement Confirmation: ETT inserted through vocal cords under direct vision,  positive ETCO2 and breath sounds checked- equal and bilateral Secured at: 21 cm Tube secured with: Tape Dental Injury: Teeth and Oropharynx as per pre-operative assessment

## 2015-09-29 NOTE — Discharge Instructions (Signed)
Laparoscopic Ovarian Surgery Discharge Instructions  RISKS AND COMPLICATIONS   Infection.  Bleeding.  Injury to surrounding organs.  Anesthetic side effects.   PROCEDURE   You may be given a medicine to help you relax (sedative) before the procedure. You will be given a medicine to make you sleep (general anesthetic) during the procedure.  A tube will be put down your throat to help your breath while under general anesthesia.  Several small cuts (incisions) are made in the lower abdominal area and one incision is made near the belly button.  Your abdominal area will be inflated with a safe gas (carbon dioxide). This helps give the surgeon room to operate, visualize, and helps the surgeon avoid other organs.  A thin, lighted tube (laparoscope) with a camera attached is inserted into your abdomen through the incision near the belly button. Other small instruments may also be inserted through other abdominal incisions.  The ovary is located and are removed.  After the ovary is removed, the gas is released from the abdomen.  The incisions will be closed with stitches (sutures), and Dermabond. A bandage may be placed over the incisions.  AFTER THE PROCEDURE   You will also have some mild abdominal discomfort for 3-7 days. You will be given pain medicine to ease any discomfort.  As long as there are no problems, you may be allowed to go home. Someone will need to drive you home and be with you for at least 24 hours once home.  You may have some mild discomfort in the throat. This is from the tube placed in your throat while you were sleeping.  You may experience discomfort in the shoulder area from some trapped air between the liver and diaphragm. This sensation is normal and will slowly go away on its own.  HOME CARE INSTRUCTIONS   Take all medicines as directed.  Only take over-the-counter or prescription medicines for pain, discomfort, or fever as directed by your  caregiver.  Resume daily activities as directed.  Showers are preferred over baths for 2 weeks.  You may resume sexual activities in 1 week or as you feel you would like to.  Do not drive while taking narcotics.  SEEK MEDICAL CARE IF: .  There is increasing abdominal pain.  You feel lightheaded or faint.  You have the chills.  You have an oral temperature above 102 F (38.9 C).  There is pus-like (purulent) drainage from any of the wounds.  You are unable to pass gas or have a bowel movement.  You feel sick to your stomach (nauseous) or throw up (vomit) and can't control it with your medicines.  MAKE SURE YOU:   Understand these instructions.  Will watch your condition.  Will get help right away if you are not doing well or get worse.  ExitCare Patient Information 2013 [place], [place].     Discharge instructions after a hysteroscopy with dilation and curettage  Signs and Symptoms to Report  Call our office at [phone] if you have any of the following:    Fever over 100.4 degrees or higher  Severe stomach pain not relieved with pain medications  Bright red bleeding thats heavier than a period that does not slow with rest after the first 24 hours  To go the bathroom a lot (frequency), you cant hold your urine (urgency), or it hurts when you empty your bladder (urinate)  Chest pain  Shortness of breath  Pain in the calves of your legs  Severe nausea and vomiting not relieved with anti-nausea medications  Any concerns  What You Can Expect after Surgery  You may see some pink tinged, bloody fluid. This is normal. You may also have cramping for several days.   Activities after Your Discharge Follow these guidelines to help speed your recovery at home:  Dont drive if you are in pain or taking narcotic pain medicine. You may drive when you can safely slam on the brakes, turn the wheel forcefully, and rotate your torso comfortably. This is  typically 4-7 days. Practice in a parking lot or side street prior to attempting to drive regularly.   Ask others to help with household chores for 4 weeks.  Dont do strenuous activities, exercises, or sports like vacuuming, tennis, squash, etc. until your doctor says it is safe to do so.  Walk as you feel able. Rest often since it may take a week or two for your energy level to return to normal.   You may climb stairs  Avoid constipation:   -Eat fruits, vegetables, and whole grains. Eat small meals as your appetite will take time to return to normal.   -Drink 6 to 8 glasses of water each day unless your doctor has told you to limit your fluids.   -Use a laxative or stool softener as needed if constipation becomes a problem. You may take Miralax, metamucil, Citrucil, Colace, Senekot, FiberCon, etc. If this does not relieve the constipation, try two tablespoons of Milk Of Magnesia every 8 hours until your bowels move.   You may shower.   Do not get in a hot tub, swimming pool, etc. until your doctor agrees.  Do not douche, use tampons, or have sex until your doctor says it is okay, usually about 2 weeks.  Take your pain medicine when you need it. The medicine may not work as well if the pain is bad.  Take the medicines you were taking before surgery. Other medications you might need are pain medications (ibuprofen), medications for constipation (Colace) and nausea medications (Zofran).

## 2015-09-29 NOTE — Anesthesia Preprocedure Evaluation (Signed)
Anesthesia Evaluation  Patient identified by MRN, date of birth, ID band Patient awake    Reviewed: Allergy & Precautions, NPO status , Patient's Chart, lab work & pertinent test results  Airway Mallampati: II       Dental  (+) Teeth Intact   Pulmonary Current Smoker,    + rhonchi        Cardiovascular Exercise Tolerance: Good  Rhythm:Regular Rate:Normal     Neuro/Psych    GI/Hepatic negative GI ROS, Neg liver ROS,   Endo/Other  negative endocrine ROS  Renal/GU negative Renal ROS     Musculoskeletal   Abdominal   Peds negative pediatric ROS (+)  Hematology negative hematology ROS (+)   Anesthesia Other Findings   Reproductive/Obstetrics                             Anesthesia Physical Anesthesia Plan  ASA: II  Anesthesia Plan: General   Post-op Pain Management:    Induction: Intravenous  Airway Management Planned: Oral ETT  Additional Equipment:   Intra-op Plan:   Post-operative Plan:   Informed Consent: I have reviewed the patients History and Physical, chart, labs and discussed the procedure including the risks, benefits and alternatives for the proposed anesthesia with the patient or authorized representative who has indicated his/her understanding and acceptance.     Plan Discussed with: CRNA  Anesthesia Plan Comments:         Anesthesia Quick Evaluation

## 2015-09-29 NOTE — Transfer of Care (Signed)
Immediate Anesthesia Transfer of Care Note  Patient: Tina Mills  Procedure(s) Performed: Procedure(s): DILATATION AND CURETTAGE /HYSTEROSCOPY WITH REMOVAL OF IUD (N/A) LAPAROSCOPY DIAGNOSTIC WITH PERITONEAL BIOPSY (N/A) LAPAROSCOPIC LYSIS OF ADHESIONS (N/A)  Patient Location: PACU  Anesthesia Type:General  Level of Consciousness: responds to stimulation  Airway & Oxygen Therapy: Patient Spontanous Breathing  Post-op Assessment: Report given to RN  Post vital signs: stable  Last Vitals:  Filed Vitals:   09/29/15 0753 09/29/15 1154  BP: 113/75 127/87  Pulse: 62 82  Temp: 36.7 C 36.4 C  Resp: 16 15    Complications: No apparent anesthesia complications

## 2015-09-29 NOTE — Interval H&P Note (Signed)
History and Physical Interval Note:  09/29/2015 9:53 AM  Tina Mills  has presented today for surgery, with the diagnosis of AUB, chronic pelvic pain, failed med mgmt  The various methods of treatment have been discussed with the patient and family. After consideration of risks, benefits and other options for treatment, the patient has consented to  Procedure(s): DILATATION AND CURETTAGE /HYSTEROSCOPY (N/A) LAPAROSCOPY DIAGNOSTIC (N/A) as a surgical intervention .  The patient's history has been reviewed, patient examined, no change in status, stable for surgery.  I have reviewed the patient's chart and labs.  Questions were answered to the patient's satisfaction.     [name]

## 2015-09-29 NOTE — Anesthesia Postprocedure Evaluation (Signed)
Anesthesia Post Note  Patient: Tina Mills  Procedure(s) Performed: Procedure(s) (LRB): DILATATION AND CURETTAGE /HYSTEROSCOPY WITH REMOVAL OF IUD (N/A) LAPAROSCOPY DIAGNOSTIC WITH PERITONEAL BIOPSY (N/A) LAPAROSCOPIC LYSIS OF ADHESIONS (N/A)  Patient location during evaluation: PACU Anesthesia Type: General Level of consciousness: awake and alert Pain management: satisfactory to patient Vital Signs Assessment: post-procedure vital signs reviewed and stable Respiratory status: respiratory function stable Cardiovascular status: stable Anesthetic complications: no    Last Vitals:  Filed Vitals:   09/29/15 0753 09/29/15 1154  BP: 113/75 127/87  Pulse: 62 82  Temp: 36.7 C 36.4 C  Resp: 16 15    Last Pain:  Filed Vitals:   09/29/15 1157  PainSc: Asleep                 VAN STAVEREN,Reford Olliff

## 2015-09-30 LAB — SURGICAL PATHOLOGY

## 2015-10-08 NOTE — H&P (Signed)
HPI: AUB- heavy menometrorrhagia since menarche, worsening recently, currently 1 tampon q2-3hrs for months.  We have tried: OCPs from age [age] and cycles became regular with Menorrhagia. Subsquently has tried Nuvaring and the patch for bleeding without relief. S/p BTL. I trialed Mirena IUD without improvement in bleeding.   Hx of known fibroids, +easy bruising - von willebrand's disease negative, normal PT/INR PTT, normal TSH, normal CBC, normal prolactin (mildly high at 25 and no repeat yet).  Acute pelvic pain: Increased in last several months,  Usually on the left side and described as cramping, sharp, terrible. Today in midline but deep in pelvis. Pain not resolved with IUD.  Ultrasound 07/10/15: Ut wnl, but endometrial thickness is significant at 17.47mm bil ovs wnl- both enlarged with R volulme of 12.6cm3 and left volume of 13.2cm3.  Free fluid seen ant cul de sac  Hx of ovarian cancer in her maternal grandmother, mother, and sister.  Endometriosis dx by biopsy 09/2015. Endometrial polyp removed. No malignancy.  Past Medical History:  has a past medical history of Dysmenorrhea; High risk HPV infection; Menorrhagia; and Unspecified dyspareunia.  Past Surgical History:  has a past surgical history that includes Tubal ligation. Family History: family history includes Cervical cancer in her maternal grandmother; No Known Problems in her father; Ovarian cancer in her maternal grandmother, mother, and sister. She was adopted. Social History:  reports that she has been smoking.  She has a 7.50 pack-year smoking history. She has never used smokeless tobacco. She reports that she does not drink alcohol or use illicit drugs. OB/GYN History:  OB History    Gravida Para Term Preterm AB TAB SAB Ectopic Multiple Living   Allergies: is allergic to penicillin. Medications:  Current Outpatient Prescriptions:  .  ibuprofen (ADVIL,MOTRIN) 800 MG tablet, Take 1 tablet (800  mg total) by mouth every 8 (eight) hours as needed for Pain., Disp: 60 tablet, Rfl: 1 .  norethindrone-ethinyl estradiol (ORTHO-NOVUM, NORTREL) 1-35 mg-mcg tablet, Take 1 tab q6hrs for 4 days, then q8 hours for 3 days, then q12 hrs for 2 days, then continue regular pack (1 pill daily)., Disp: 1 Package, Rfl: 6 .  oxyCODONE-acetaminophen (PERCOCET) 5-325 mg tablet, Take 1 tablet by mouth every 6 (six) hours as needed for Pain., Disp: 30 tablet, Rfl: 0  Review of Systems  Constitutional: Negative.  Negative for fatigue and fever.  HENT: Negative.   Respiratory: Negative.  Negative for shortness of breath.   Cardiovascular: Negative for chest pain and palpitations.  Gastrointestinal: Negative for abdominal pain, constipation and diarrhea.  Endocrine: Negative for cold intolerance.  Genitourinary: Positive for dyspareunia and pelvic pain. Negative for difficulty urinating, dysuria, frequency, hematuria, menstrual problem, vaginal bleeding, vaginal discharge and vaginal pain.       Abnormal uterine bleeding  Neurological: Negative for dizziness and light-headedness.  Psychiatric/Behavioral:       No changes in mood     Exam:     Visit Vitals  . BP 129/82  . Pulse 91  . Wt 71.8 kg (158 lb 3.2 oz)  . BMI 22.7 kg/m2   Physical Exam  Constitutional: She is oriented to person, place, and time. She appears well-developed and well-nourished. No distress.  Eyes: No scleral icterus.  Neck: Normal range of motion. Neck supple. No tracheal deviation present. No thyromegaly present.  Cardiovascular: Normal rate, regular rhythm and normal heart sounds.   No murmur heard. Pulmonary/Chest:  Effort normal and breath sounds normal. She has no wheezes. She has no rales. Right breast exhibits no inverted nipple, no mass, no nipple discharge, no skin change and no tenderness. Left breast exhibits no inverted nipple, no mass, no nipple discharge, no skin change and no tenderness. Breasts are symmetrical.   Abdominal: She exhibits no distension and no mass. There is no tenderness. There is no rebound and no guarding.  Genitourinary:  Genitourinary Comments:     Pelvic exam:        External: Tanner stage 5, normal female genitalia without lesions or masses        Bladder: Normal size without masses or tenderness, well-supported        Urethra: No lesions or discharge with palpation. Normal urethral size and location, no prolapse        Vagina: normal physiological discharge, without lesions or masses        Cervix: normal without lesions or masses        Adnexa: normal bimanual exam without masses or fullness        Uterus: Normal size and position without masses or tenderness.         Anus/Perineum: Normal external exam  Musculoskeletal: Normal range of motion.  Lymphadenopathy:    She has no cervical adenopathy.  Neurological: She is alert and oriented to person, place, and time.  Skin: Skin is warm and dry.  Psychiatric: She has a normal mood and affect.   Left sided adnexal pain, at single point      Impression:   The primary encounter diagnosis was Abnormal uterine bleeding (AUB). A diagnosis of Pelvic pain in female was also pertinent to this visit.    Plan:   Patient returns for a preoperative discussion regarding her plans to proceed with definitive surgical treatment of her endometriosis pain and AUB by total laparoscopic hysterectomy with bilateral salpingectomy and left oophorectomy, fulguration of endometriotic implants.  The patient and I discussed the technical aspects of the procedure including the potential for risks and complications.  These include but are not limited to the risk of infection requiring post-operative antibiotics or further procedures.  We talked about the risk of injury to adjacent organs including bladder, bowel, ureter, blood vessels or nerves.  We talked about the need to convert to an open incision.  We talked about the possible need for blood  transfusion.  We talked about postop complications such as thromboembolic or cardiopulmonary complications.  All of her questions were answered.  Her preoperative exam was completed and the appropriate consents were signed. She is scheduled to undergo this procedure in the near future.   No orders of the defined types were placed in this encounter.   Return in about 2 weeks (around 11/05/2015) for Postop check.  Lyda Colcord Babette Relic, MD   25 min spent with the patient, with >50% spent in counseling and clinical decision making

## 2015-10-14 ENCOUNTER — Encounter
Admission: RE | Admit: 2015-10-14 | Discharge: 2015-10-14 | Disposition: A | Discharge status: Home / Self Care | Payer: Managed Care, Other (non HMO) | Source: Ambulatory Visit | Attending: Obstetrics and Gynecology | Admitting: Obstetrics and Gynecology

## 2015-10-14 DIAGNOSIS — Z01812 Encounter for preprocedural laboratory examination: Secondary | ICD-10-CM | POA: Diagnosis present

## 2015-10-14 LAB — BASIC METABOLIC PANEL
Anion gap: 5 (ref 5–15)
BUN: 15 mg/dL (ref 6–20)
CHLORIDE: 103 mmol/L (ref 101–111)
CO2: 31 mmol/L (ref 22–32)
CREATININE: 0.7 mg/dL (ref 0.44–1.00)
Calcium: 9.6 mg/dL (ref 8.9–10.3)
GFR calc Af Amer: >60 mL/min (ref >60)
GFR calc non Af Amer: >60 mL/min (ref >60)
GLUCOSE: 101 mg/dL — AB (ref 65–99)
Potassium: 3.9 mmol/L (ref 3.5–5.1)
Sodium: 139 mmol/L (ref 135–145)

## 2015-10-14 LAB — CBC
HCT: 39.2 % (ref 35.0–47.0)
Hemoglobin: 13 g/dL (ref 12.0–16.0)
MCH: 26.8 pg (ref 26.0–34.0)
MCHC: 33.2 g/dL (ref 32.0–36.0)
MCV: 80.8 fL (ref 80.0–100.0)
PLATELETS: 329 10*3/uL (ref 150–440)
RBC: 4.86 MIL/uL (ref 3.80–5.20)
RDW: 15.2 % — AB (ref 11.5–14.5)
WBC: 7.8 10*3/uL (ref 3.6–11.0)

## 2015-10-14 NOTE — Patient Instructions (Signed)
  Your procedure is scheduled on: Monday Feb. 27, 2017. Report to Same Day Surgery. To find out your arrival time please call [phone] between 1PM - 3PM on Friday Feb.24, 2017 .  Remember: Instructions that are not followed completely may result in serious medical risk, up to and including death, or upon the discretion of your surgeon and anesthesiologist your surgery may need to be rescheduled.    _x___ 1. Do not eat food or drink liquids after midnight. No gum chewing or hard candies.     ____ 2. No Alcohol for 24 hours before or after surgery.   ____ 3. Bring all medications with you on the day of surgery if instructed.    __x__ 4. Notify your doctor if there is any change in your medical condition     (cold, fever, infections).     Do not wear jewelry, make-up, hairpins, clips or nail polish.  Do not wear lotions, powders, or perfumes. You may wear deodorant.  Do not shave 48 hours prior to surgery. Men may shave face and neck.  Do not bring valuables to the hospital.    [facility] is not responsible for any belongings or valuables.               Contacts, dentures or bridgework may not be worn into surgery.  Leave your suitcase in the car. After surgery it may be brought to your room.  For patients admitted to the hospital, discharge time is determined by your treatment team.   Patients discharged the day of surgery will not be allowed to drive home.    Please read over the following fact sheets that you were given:   [facility] Preparing for Surgery  ____ Take these medicines the morning of surgery with A SIP OF WATER: None   ____ Fleet Enema (as directed)   _x___ Use CHG Soap as directed  ____ Use inhalers on the day of surgery  ____ Stop metformin 2 days prior to surgery    ____ Take 1/2 of usual insulin dose the night before surgery and none on the morning of  surgery.   ____ Stop Coumadin/Plavix/aspirin on denies  __x__ Already stopped  Anti-inflammatories on 10/05/15.   ____ Stop supplements until after surgery.    __x__ Bring incentive spirometer with you to hospital

## 2015-10-15 LAB — TYPE AND SCREEN
ABO/RH(D): A POS
Antibody Screen: NEGATIVE

## 2015-10-20 ENCOUNTER — Encounter
Admission: RE | Disposition: A | Discharge status: Home / Self Care | Payer: Self-pay | Source: Ambulatory Visit | Attending: Obstetrics and Gynecology

## 2015-10-20 ENCOUNTER — Encounter: Payer: Self-pay | Admitting: *Deleted

## 2015-10-20 ENCOUNTER — Ambulatory Visit: Payer: Managed Care, Other (non HMO) | Admitting: Anesthesiology

## 2015-10-20 ENCOUNTER — Observation Stay
Admission: RE | Admit: 2015-10-20 | Discharge: 2015-10-20 | Disposition: A | Discharge status: Home / Self Care | Payer: Managed Care, Other (non HMO) | Source: Ambulatory Visit | Attending: Obstetrics and Gynecology | Admitting: Obstetrics and Gynecology

## 2015-10-20 DIAGNOSIS — N921 Excessive and frequent menstruation with irregular cycle: Secondary | ICD-10-CM | POA: Diagnosis not present

## 2015-10-20 DIAGNOSIS — F17200 Nicotine dependence, unspecified, uncomplicated: Secondary | ICD-10-CM | POA: Diagnosis not present

## 2015-10-20 DIAGNOSIS — R102 Pelvic and perineal pain: Secondary | ICD-10-CM | POA: Insufficient documentation

## 2015-10-20 DIAGNOSIS — Z791 Long term (current) use of non-steroidal anti-inflammatories (NSAID): Secondary | ICD-10-CM | POA: Insufficient documentation

## 2015-10-20 DIAGNOSIS — N8302 Follicular cyst of left ovary: Secondary | ICD-10-CM | POA: Insufficient documentation

## 2015-10-20 DIAGNOSIS — N72 Inflammatory disease of cervix uteri: Principal | ICD-10-CM | POA: Insufficient documentation

## 2015-10-20 DIAGNOSIS — N8312 Corpus luteum cyst of left ovary: Secondary | ICD-10-CM | POA: Insufficient documentation

## 2015-10-20 DIAGNOSIS — N809 Endometriosis, unspecified: Secondary | ICD-10-CM | POA: Insufficient documentation

## 2015-10-20 DIAGNOSIS — Z79891 Long term (current) use of opiate analgesic: Secondary | ICD-10-CM | POA: Diagnosis not present

## 2015-10-20 DIAGNOSIS — Z793 Long term (current) use of hormonal contraceptives: Secondary | ICD-10-CM | POA: Diagnosis not present

## 2015-10-20 DIAGNOSIS — Z880 Allergy status to penicillin: Secondary | ICD-10-CM | POA: Diagnosis not present

## 2015-10-20 DIAGNOSIS — Z8041 Family history of malignant neoplasm of ovary: Secondary | ICD-10-CM | POA: Insufficient documentation

## 2015-10-20 DIAGNOSIS — N939 Abnormal uterine and vaginal bleeding, unspecified: Secondary | ICD-10-CM | POA: Diagnosis present

## 2015-10-20 HISTORY — PX: LAPAROSCOPIC HYSTERECTOMY: SHX1926

## 2015-10-20 HISTORY — PX: CYSTOSCOPY: SHX5120

## 2015-10-20 HISTORY — PX: LAPAROSCOPIC BILATERAL SALPINGECTOMY: SHX5889

## 2015-10-20 LAB — POCT PREGNANCY, URINE: Preg Test, Ur: NEGATIVE

## 2015-10-20 SURGERY — HYSTERECTOMY, TOTAL, LAPAROSCOPIC
Anesthesia: General

## 2015-10-20 MED ORDER — KETOROLAC TROMETHAMINE 30 MG/ML IJ SOLN
INTRAMUSCULAR | Status: DC | PRN
  Administered 2015-10-20: 30 mg via INTRAVENOUS

## 2015-10-20 MED ORDER — CEFAZOLIN SODIUM-DEXTROSE 2-3 GM-% IV SOLR: 2.0000 g | INTRAVENOUS | Status: DC

## 2015-10-20 MED ORDER — GLYCOPYRROLATE 0.2 MG/ML IJ SOLN
INTRAMUSCULAR | Status: DC | PRN
  Administered 2015-10-20: 0.4 mg via INTRAVENOUS

## 2015-10-20 MED ORDER — ONDANSETRON HCL 4 MG/2ML IJ SOLN
INTRAMUSCULAR | Status: AC
  Filled 2015-10-20: qty 2

## 2015-10-20 MED ORDER — FENTANYL CITRATE (PF) 100 MCG/2ML IJ SOLN
INTRAMUSCULAR | Status: DC | PRN
  Administered 2015-10-20 (×4): 25 ug via INTRAVENOUS
  Administered 2015-10-20 (×2): 50 ug via INTRAVENOUS
  Administered 2015-10-20: 100 ug via INTRAVENOUS

## 2015-10-20 MED ORDER — BUPIVACAINE HCL (PF) 0.5 % IJ SOLN
INTRAMUSCULAR | Status: AC
  Filled 2015-10-20: qty 30

## 2015-10-20 MED ORDER — DEXAMETHASONE SODIUM PHOSPHATE 10 MG/ML IJ SOLN
INTRAMUSCULAR | Status: DC | PRN
  Administered 2015-10-20: 4 mg via INTRAVENOUS

## 2015-10-20 MED ORDER — FENTANYL CITRATE (PF) 100 MCG/2ML IJ SOLN
25.0000 ug | INTRAMUSCULAR | Status: DC | PRN
  Administered 2015-10-20 (×2): 25 ug via INTRAVENOUS

## 2015-10-20 MED ORDER — CLINDAMYCIN PHOSPHATE 600 MG/50ML IV SOLN
INTRAVENOUS | Status: DC | PRN
  Administered 2015-10-20: 600 mg via INTRAVENOUS

## 2015-10-20 MED ORDER — LACTATED RINGERS IV SOLN
INTRAVENOUS | Status: DC
  Administered 2015-10-20 (×2): via INTRAVENOUS

## 2015-10-20 MED ORDER — FAMOTIDINE 20 MG PO TABS
ORAL_TABLET | ORAL | Status: AC
  Administered 2015-10-20: 20 mg via ORAL
  Filled 2015-10-20: qty 1

## 2015-10-20 MED ORDER — SODIUM CHLORIDE 0.9 % IJ SOLN
INTRAMUSCULAR | Status: AC
  Filled 2015-10-20: qty 10

## 2015-10-20 MED ORDER — GENTAMICIN SULFATE 40 MG/ML IJ SOLN
INTRAMUSCULAR | Status: AC
  Filled 2015-10-20: qty 2

## 2015-10-20 MED ORDER — BUPIVACAINE HCL 0.5 % IJ SOLN
INTRAMUSCULAR | Status: DC | PRN
  Administered 2015-10-20: 10 mL
  Administered 2015-10-20: 17 mL

## 2015-10-20 MED ORDER — MIDAZOLAM HCL 2 MG/2ML IJ SOLN
INTRAMUSCULAR | Status: DC | PRN
  Administered 2015-10-20: 2 mg via INTRAVENOUS

## 2015-10-20 MED ORDER — FAMOTIDINE 20 MG PO TABS
20.0000 mg | ORAL_TABLET | Freq: Once | ORAL | Status: AC
  Administered 2015-10-20: 20 mg via ORAL

## 2015-10-20 MED ORDER — OXYCODONE-ACETAMINOPHEN 5-325 MG PO TABS: 1.0000 | ORAL_TABLET | Freq: Four times a day (QID) | ORAL | Status: AC | PRN

## 2015-10-20 MED ORDER — ONDANSETRON HCL 4 MG/2ML IJ SOLN
4.0000 mg | Freq: Once | INTRAMUSCULAR | Status: AC | PRN
  Administered 2015-10-20: 4 mg via INTRAVENOUS

## 2015-10-20 MED ORDER — FENTANYL CITRATE (PF) 100 MCG/2ML IJ SOLN
INTRAMUSCULAR | Status: AC
  Filled 2015-10-20: qty 2

## 2015-10-20 MED ORDER — PROMETHAZINE HCL 25 MG/ML IJ SOLN
INTRAMUSCULAR | Status: AC
  Filled 2015-10-20: qty 1

## 2015-10-20 MED ORDER — LIDOCAINE HCL (CARDIAC) 20 MG/ML IV SOLN
INTRAVENOUS | Status: DC | PRN
  Administered 2015-10-20: 80 mg via INTRAVENOUS

## 2015-10-20 MED ORDER — CEFAZOLIN SODIUM-DEXTROSE 2-3 GM-% IV SOLR
INTRAVENOUS | Status: AC
  Filled 2015-10-20: qty 50

## 2015-10-20 MED ORDER — ONDANSETRON HCL 4 MG/2ML IJ SOLN
INTRAMUSCULAR | Status: DC | PRN
  Administered 2015-10-20: 4 mg via INTRAVENOUS

## 2015-10-20 MED ORDER — CLINDAMYCIN PHOSPHATE 600 MG/50ML IV SOLN
INTRAVENOUS | Status: AC
  Filled 2015-10-20: qty 50

## 2015-10-20 MED ORDER — PROPOFOL 10 MG/ML IV BOLUS
INTRAVENOUS | Status: DC | PRN
  Administered 2015-10-20: 130 mg via INTRAVENOUS

## 2015-10-20 MED ORDER — LACTATED RINGERS IV SOLN: INTRAVENOUS | Status: DC

## 2015-10-20 MED ORDER — NEOSTIGMINE METHYLSULFATE 10 MG/10ML IV SOLN
INTRAVENOUS | Status: DC | PRN
  Administered 2015-10-20: 3 mg via INTRAVENOUS

## 2015-10-20 MED ORDER — PROMETHAZINE HCL 25 MG/ML IJ SOLN
6.2500 mg | INTRAMUSCULAR | Status: DC | PRN
  Administered 2015-10-20: 6.25 mg via INTRAVENOUS

## 2015-10-20 MED ORDER — SODIUM CHLORIDE 0.9 % IV SOLN
107.5500 mg | INTRAVENOUS | Status: DC | PRN
  Administered 2015-10-20: 80 mg via INTRAVENOUS

## 2015-10-20 MED ORDER — ROCURONIUM BROMIDE 100 MG/10ML IV SOLN
INTRAVENOUS | Status: DC | PRN
  Administered 2015-10-20 (×2): 10 mg via INTRAVENOUS
  Administered 2015-10-20: 5 mg via INTRAVENOUS
  Administered 2015-10-20: 40 mg via INTRAVENOUS

## 2015-10-20 SURGICAL SUPPLY — 58 items
APPLICATOR ARISTA FLEXITIP XL (MISCELLANEOUS) ×6 IMPLANT
BAG URO DRAIN 2000ML W/SPOUT (MISCELLANEOUS) ×6 IMPLANT
BLADE SURG SZ11 CARB STEEL (BLADE) ×6 IMPLANT
CATH FOLEY 2WAY  5CC 16FR (CATHETERS) ×2
CATH ROBINSON RED A/P 16FR (CATHETERS) ×6 IMPLANT
CATH URTH 16FR FL 2W BLN LF (CATHETERS) ×4 IMPLANT
CHLORAPREP W/TINT 26ML (MISCELLANEOUS) ×6 IMPLANT
CLOSURE WOUND 1/4X4 (GAUZE/BANDAGES/DRESSINGS) ×1
DEFOGGER SCOPE WARMER CLEARIFY (MISCELLANEOUS) ×6 IMPLANT
DEVICE SUTURE ENDOST 10MM (ENDOMECHANICALS) ×6 IMPLANT
DRSG TEGADERM 2-3/8X2-3/4 SM (GAUZE/BANDAGES/DRESSINGS) ×18 IMPLANT
ENDOPOUCH RETRIEVER 10 (MISCELLANEOUS) IMPLANT
ENDOSTITCH 0 SINGLE 48 (SUTURE) IMPLANT
GAUZE SPONGE NON-WVN 2X2 STRL (MISCELLANEOUS) ×8 IMPLANT
GLOVE BIO SURGEON STRL SZ 6.5 (GLOVE) ×20 IMPLANT
GLOVE BIO SURGEONS STRL SZ 6.5 (GLOVE) ×4
GLOVE INDICATOR 7.0 STRL GRN (GLOVE) ×24 IMPLANT
GOWN STRL REUS W/ TWL LRG LVL3 (GOWN DISPOSABLE) ×8 IMPLANT
GOWN STRL REUS W/ TWL XL LVL3 (GOWN DISPOSABLE) ×4 IMPLANT
GOWN STRL REUS W/TWL LRG LVL3 (GOWN DISPOSABLE) ×4
GOWN STRL REUS W/TWL XL LVL3 (GOWN DISPOSABLE) ×2
HEMOSTAT ARISTA ABSORB 3G PWDR (MISCELLANEOUS) ×6 IMPLANT
IRRIGATION STRYKERFLOW (MISCELLANEOUS) ×4 IMPLANT
IRRIGATOR STRYKERFLOW (MISCELLANEOUS) ×6
IV SOD CHL 0.9% 1000ML (IV SOLUTION) ×6 IMPLANT
KIT RM TURNOVER CYSTO AR (KITS) ×6 IMPLANT
LABEL OR SOLS (LABEL) ×6 IMPLANT
LIGASURE 5MM LAPAROSCOPIC (INSTRUMENTS) ×6 IMPLANT
LIQUID BAND (GAUZE/BANDAGES/DRESSINGS) ×6 IMPLANT
MANIPULATOR VCARE LG CRV RETR (MISCELLANEOUS) ×6 IMPLANT
MANIPULATOR VCARE STD CRV RETR (MISCELLANEOUS) IMPLANT
NEEDLE VERESS 14GA 120MM (NEEDLE) ×6 IMPLANT
NS IRRIG 500ML POUR BTL (IV SOLUTION) ×6 IMPLANT
OCCLUDER COLPOPNEUMO (BALLOONS) ×6 IMPLANT
PACK GYN LAPAROSCOPIC (MISCELLANEOUS) ×6 IMPLANT
PAD OB MATERNITY 4.3X12.25 (PERSONAL CARE ITEMS) ×6 IMPLANT
PAD PREP 24X41 OB/GYN DISP (PERSONAL CARE ITEMS) ×6 IMPLANT
SCISSORS METZENBAUM CVD 33 (INSTRUMENTS) ×6 IMPLANT
SET CYSTO W/LG BORE CLAMP LF (SET/KITS/TRAYS/PACK) ×6 IMPLANT
SHEARS HARMONIC ACE PLUS 36CM (ENDOMECHANICALS) IMPLANT
SLEEVE ENDOPATH XCEL 5M (ENDOMECHANICALS) ×6 IMPLANT
SPONGE LAP 18X18 5 PK (GAUZE/BANDAGES/DRESSINGS) IMPLANT
SPONGE VERSALON 2X2 STRL (MISCELLANEOUS) ×4
STRIP CLOSURE SKIN 1/4X4 (GAUZE/BANDAGES/DRESSINGS) ×5 IMPLANT
SUT ENDO VLOC 180-0-8IN (SUTURE) ×6 IMPLANT
SUT MNCRL AB 4-0 PS2 18 (SUTURE) IMPLANT
SUT VIC AB 2-0 UR6 27 (SUTURE) ×6 IMPLANT
SUT VIC AB 4-0 SH 27 (SUTURE) ×2
SUT VIC AB 4-0 SH 27XANBCTRL (SUTURE) ×4 IMPLANT
SUTURE MNCRYL 4-0 (SUTURE) ×6 IMPLANT
SWABSTK COMLB BENZOIN TINCTURE (MISCELLANEOUS) ×6 IMPLANT
SYR 50ML LL SCALE MARK (SYRINGE) ×6 IMPLANT
SYRINGE 10CC LL (SYRINGE) ×6 IMPLANT
TROCAR ENDO BLADELESS 11MM (ENDOMECHANICALS) ×6 IMPLANT
TROCAR XCEL NON-BLD 5MMX100MML (ENDOMECHANICALS) ×6 IMPLANT
TROCAR XCEL UNIV SLVE 11M 100M (ENDOMECHANICALS) ×6 IMPLANT
TUBING INSUFFLATOR HEATED (MISCELLANEOUS) ×6 IMPLANT
TUBING INSUFFLATOR HI FLOW (MISCELLANEOUS) ×6 IMPLANT

## 2015-10-20 NOTE — Anesthesia Postprocedure Evaluation (Signed)
Anesthesia Post Note  Patient: Tina Mills  Procedure(s) Performed: Procedure(s) (LRB): HYSTERECTOMY TOTAL LAPAROSCOPIC (N/A) LAPAROSCOPIC BILATERAL SALPINGECTOMY (Bilateral) LAPAROSCOPIC OOPHORECTOMY (Left) CYSTOSCOPY  Patient location during evaluation: PACU Anesthesia Type: General Level of consciousness: awake and alert Pain management: pain level controlled Vital Signs Assessment: post-procedure vital signs reviewed and stable Respiratory status: spontaneous breathing, nonlabored ventilation, respiratory function stable and patient connected to nasal cannula oxygen Cardiovascular status: blood pressure returned to baseline and stable Postop Assessment: no signs of nausea or vomiting Anesthetic complications: no    Last Vitals:  Filed Vitals:   10/20/15 1103 10/20/15 1203  BP: 120/87 123/80  Pulse: 57 66  Temp: 36.6 C 35.6 C  Resp: 16 16    Last Pain:  Filed Vitals:   10/20/15 1219  PainSc: 3                  [name] S

## 2015-10-20 NOTE — Progress Notes (Signed)
Per PACU RN, pt voided on arrival to postop - received phenergan for nausea in PACU, advises MD will put in discharge order (was orig in for OBSERVATION), per nurse, MD advised "patient wants to go home".  Pt sleepy at present, denies pain, spouse in room.

## 2015-10-20 NOTE — Anesthesia Procedure Notes (Signed)
Procedure Name: Intubation Performed by: Lindell Renfrew Pre-anesthesia Checklist: Patient identified, Emergency Drugs available, Suction available, Patient being monitored and Timeout performed Patient Re-evaluated:Patient Re-evaluated prior to inductionOxygen Delivery Method: Circle system utilized Preoxygenation: Pre-oxygenation with 100% oxygen Intubation Type: IV induction Ventilation: Mask ventilation without difficulty Laryngoscope Size: Mac and 3 Grade View: Grade I Tube type: Oral Tube size: 7.0 mm Number of attempts: 1 Airway Equipment and Method: Stylet Placement Confirmation: ETT inserted through vocal cords under direct vision,  positive ETCO2 and breath sounds checked- equal and bilateral Secured at: 20 cm Tube secured with: Tape Dental Injury: Teeth and Oropharynx as per pre-operative assessment      

## 2015-10-20 NOTE — Interval H&P Note (Signed)
History and Physical Interval Note:  10/20/2015 7:27 AM  Tina Mills  has presented today for surgery, with the diagnosis of Endometriosis, Pelvic pain, Abnormal Uterine Bleeding  The various methods of treatment have been discussed with the patient and family. After consideration of risks, benefits and other options for treatment, the patient has consented to  Procedure(s): HYSTERECTOMY TOTAL LAPAROSCOPIC (N/A) LAPAROSCOPIC BILATERAL SALPINGECTOMY (Bilateral) LAPAROSCOPIC OOPHORECTOMY (Left) as a surgical intervention .  The patient's history has been reviewed, patient examined, no change in status, stable for surgery.  I have reviewed the patient's chart and labs.  Questions were answered to the patient's satisfaction.     [name]

## 2015-10-20 NOTE — Discharge Instructions (Signed)
Signs and Symptoms to Report Call our office at [phone] if you have any of the following.   Fever over 100.4 degrees or higher  Severe stomach pain not relieved with pain medications  Bright red bleeding thats heavier than a period that does not slow with rest  To go the bathroom a lot (frequency), you cant hold your urine (urgency), or it hurts when you empty your bladder (urinate)  Chest pain  Shortness of breath  Pain in the calves of your legs  Severe nausea and vomiting not relieved with anti-nausea medications  Signs of infection around your wounds, such as redness, hot to touch, swelling, green/yellow drainage (like pus), bad smelling discharge  Any concerns  What You Can Expect after Surgery  You may see some pink tinged, bloody fluid and bruising around the wound. This is normal.  You may notice shoulder and neck pain. This is caused by the gas used during surgery to expand your abdomen so your surgeon could get to the uterus easier.  You may have a sore throat because of the tube in your mouth during general anesthesia. This will go away in 2 to 3 days.  You may have some stomach cramps.  You may notice spotting on your panties.  You may have pain around the incision sites.   Activities after Your Discharge Follow these guidelines to help speed your recovery at home:  Do the coughing and deep breathing as you did in the hospital for 2 weeks. Use the small blue breathing device, called the incentive spirometer for 2 weeks.  Dont drive if you are in pain or taking narcotic pain medicine. You may drive when you can safely slam on the brakes, turn the wheel forcefully, and rotate your torso comfortably. This is typically 1-2 weeks. Practice in a parking lot or side street prior to attempting to drive regularly.   Ask others to help with household chores for 4 weeks.  Do not lift anything heavier that 10 pounds for 4-6 weeks. This includes pets,  children, and groceries.  Dont do strenuous activities, exercises, or sports like vacuuming, tennis, squash, etc. until your doctor says it is safe to do so. ---If you had a hysterectomy (abdominal, laparoscopic, or vaginal) do not have intercourse for 8-10 weeks.   Walk as you feel able. Rest often since it may take two or three weeks for your energy level to return to normal.   You may climb stairs  Avoid constipation:   -Eat fruits, vegetables, and whole grains. Eat small meals as your appetite will take time to return to normal.   -Drink 6 to 8 glasses of water each day unless your doctor has told you to limit your fluids.   -Use a laxative or stool softener as needed if constipation becomes a problem. You may take Miralax, metamucil, Citrucil, Colace, Senekot, FiberCon, etc. If this does not relieve the constipation, try two tablespoons of Milk Of Magnesia every 8 hours until your bowels move.   You may shower. Gently wash the wounds with a mild soap and water. Pat dry.  Do not get in a hot tub, swimming pool, etc. until your doctor agrees.  Do not use lotions, oils, powders on the wounds.  Do not douche, use tampons, or have sex until your doctor says it is okay.  Take your pain medicine when you need it. The medicine may not work as well if the pain is bad.  Take the medicines you were  taking before surgery. Other medications you will need are pain medications (Norco or Percocet) and nausea medications (Zofran).   AMBULATORY SURGERY  DISCHARGE INSTRUCTIONS   1) The drugs that you were given will stay in your system until tomorrow so for the next 24 hours you should not:  A) Drive an automobile B) Make any legal decisions C) Drink any alcoholic beverage   2) You may resume regular meals tomorrow.  Today it is better to start with liquids and gradually work up to solid foods.  You may eat anything you prefer, but it is better to start with liquids, then soup and crackers,  and gradually work up to solid foods.   3) Please notify your doctor immediately if you have any unusual bleeding, trouble breathing, redness and pain at the surgery site, drainage, fever, or pain not relieved by medication.    4) Additional Instructions:        Please contact your physician with any problems or Same Day Surgery at [phone], Monday through Friday 6 am to 4 pm, or [place] at [facility] number at [phone].  AMBULATORY SURGERY  DISCHARGE INSTRUCTIONS   5) The drugs that you were given will stay in your system until tomorrow so for the next 24 hours you should not:  D) Drive an automobile E) Make any legal decisions F) Drink any alcoholic beverage   6) You may resume regular meals tomorrow.  Today it is better to start with liquids and gradually work up to solid foods.  You may eat anything you prefer, but it is better to start with liquids, then soup and crackers, and gradually work up to solid foods.   7) Please notify your doctor immediately if you have any unusual bleeding, trouble breathing, redness and pain at the surgery site, drainage, fever, or pain not relieved by medication.    8) Additional Instructions:        Please contact your physician with any problems or Same Day Surgery at [phone], Monday through Friday 6 am to 4 pm, or [place] at [facility] number at [phone].

## 2015-10-20 NOTE — Anesthesia Preprocedure Evaluation (Signed)
Anesthesia Evaluation  Patient identified by MRN, date of birth, ID band Patient awake    Reviewed: Allergy & Precautions, NPO status , Patient's Chart, lab work & pertinent test results, reviewed documented beta blocker date and time   History of Anesthesia Complications (+) Family history of anesthesia reaction  Airway Mallampati: II  TM Distance: >3 FB     Dental  (+) Chipped   Pulmonary Current Smoker,           Cardiovascular      Neuro/Psych    GI/Hepatic   Endo/Other    Renal/GU      Musculoskeletal   Abdominal   Peds  Hematology   Anesthesia Other Findings   Reproductive/Obstetrics                             Anesthesia Physical Anesthesia Plan  ASA: II  Anesthesia Plan: General   Post-op Pain Management:    Induction: Intravenous  Airway Management Planned: Oral ETT  Additional Equipment:   Intra-op Plan:   Post-operative Plan:   Informed Consent: I have reviewed the patients History and Physical, chart, labs and discussed the procedure including the risks, benefits and alternatives for the proposed anesthesia with the patient or authorized representative who has indicated his/her understanding and acceptance.     Plan Discussed with: CRNA  Anesthesia Plan Comments:         Anesthesia Quick Evaluation

## 2015-10-20 NOTE — Transfer of Care (Signed)
Immediate Anesthesia Transfer of Care Note  Patient: Tina Mills  Procedure(s) Performed: Procedure(s): HYSTERECTOMY TOTAL LAPAROSCOPIC (N/A) LAPAROSCOPIC BILATERAL SALPINGECTOMY (Bilateral) LAPAROSCOPIC OOPHORECTOMY (Left) CYSTOSCOPY  Patient Location: PACU  Anesthesia Type:General  Level of Consciousness: awake, alert  and oriented  Airway & Oxygen Therapy: Patient Spontanous Breathing and Patient connected to face mask oxygen  Post-op Assessment: Report given to RN, Post -op Vital signs reviewed and stable and Patient moving all extremities  Post vital signs: Reviewed and stable  Last Vitals:  Filed Vitals:   10/20/15 0619  BP: 117/79  Pulse: 85  Temp: 36.4 C  Resp: 16    Complications: No apparent anesthesia complications

## 2015-10-20 NOTE — Op Note (Signed)
Tina Mills PROCEDURE DATE: 10/20/2015  PREOPERATIVE DIAGNOSIS: Endometriosis with pelvic pain, abnormal uterine bleeding POSTOPERATIVE DIAGNOSIS: The same PROCEDURE: Total laparoscopic hysterectomy with laparoscopic closure, bilateral salpingectomy, LEFT oopherectomy, cystoscopy SURGEON:  Dr. [name] ASSISTANT: Dr. Maisie Fus Schermerhorn Anesthesiologist: Berdine Addison, MD Anesthesiologist: Berdine Addison, MD CRNA: Henrietta Hoover, CRNA; Casey Burkitt, CRNA  INDICATIONS: 30 y.o.  here for definitive surgical management secondary to the indications listed under preoperative diagnoses; please see preoperative note for further details.  Risks of surgery were discussed with the patient including but not limited to: bleeding which may require transfusion or reoperation; infection which may require antibiotics; injury to bowel, bladder, ureters or other surrounding organs; need for additional procedures; thromboembolic phenomenon, incisional problems and other postoperative/anesthesia complications. Written informed consent was obtained.    FINDINGS:  Normal uterus, vagina and cervix. Enlarged ovaries bilaterally. Left ovary with endometriotic implants. Left pelvic sidewall adhesions, but much improved after last surgery when we performed adhesiolysis and placement of the adhesion barrier. Normal cystoscopy with good outflow from both ureteral openings. Vascular area at trigone but no clear lesions to indicate IC. No new endometriosis noted.  ANESTHESIA:    General INTRAVENOUS FLUIDS:1100  ml ESTIMATED BLOOD LOSS:25 ml URINE OUTPUT: 400 ml   SPECIMENS: Uterus, cervix, bilateral fallopian tubes, left ovary COMPLICATIONS: None immediate  PROCEDURE IN DETAIL:  The patient received prophalactic intravenous antibiotics and had sequential compression devices applied to her lower extremities while in the preoperative area.  She was then taken to the operating room where general anesthesia was  administered and was found to be adequate.  She was placed in the dorsal lithotomy position, and was prepped and draped in a sterile manner.  A formal time out was performed with all team members present and in agreement.  A V-care uterine manipulator was placed at this time.  A Foley catheter was inserted into her bladder and attached to constant drainage. Attention was turned to the abdomen where an umbilical incision was made with the scalpel.  The Optiview 11-mm trocar and sleeve were then advanced without difficulty with the laparoscope under direct visualization into the abdomen.  The abdomen was then insufflated with carbon dioxide gas and adequate pneumoperitoneum was obtained.  A survey of the patient's pelvis and abdomen revealed the findings above.  Bilateral lower quadrant ports (5 mm on the right and 11 mm on the left) were then placed under direct visualization.  The pelvis was then carefully examined.  The left ovarian blood supply was clamped and cauterized. Attention was turned to the fallopian tubes; these were freed from the underlying mesosalpinx and the uterine attachments using the Ligasure device.  The bilateral round and broad ligaments were then clamped and transected with the Ligasure device.  The uterine artery was then skeletonized and a bladder flap was created.  The ureters were noted to be safely away from the area of dissection.  The bladder was then bluntly dissected off the lower uterine segment.    At this point, attention was turned to the uterine vessels, which were clamped and ligated using the Harmonic scalpel.  Good hemostasis was noted overall.  The uterosacral and cardinal ligaments were clamped, cut and ligated bilaterally .  Attention was then turned to the cervicovaginal junction, and the monopolar scissors were used to transect the cervix from the surrounding vagina using the ring of the V-care as a guide. This was done circumferentially allowing total hysterectomy.   The specimen was then removed from the vagina.  The vaginal cuff incision was then closed with running 0 V-lock suture using the Endoclose device.  Overall excellent hemostasis was noted.  The ureters were reexamined bilaterally and were pulsating normally. The abdominal pressure was reduced and hemostasis was confirmed. 1g of starch was placed on the vaginal cuff to discourage oozing.  Cystoscopy showed bilateral ureteral jets.  No stitches were visualized in the bladder during cystoscopy.    All trocars were removed under direct visualization, and the abdomen was desufflated.  The fascial incision of the umbilicus and left lower quadrant were closed with a 0 Vicryl figure of eight stitch.  All skin incisions were closed with 4-0 Vicryl subcuticular stitches and Dermabond. The patient tolerated the procedures well.  All instruments, needles, and sponge counts were correct x 2. The patient was taken to the recovery room awake, extubated and in stable condition.

## 2015-10-21 LAB — SURGICAL PATHOLOGY

## 2016-04-18 IMAGING — CR DG LUMBAR SPINE 2-3V
1 series · 3 of 3 positions shown · non-contrast
Comparison: CT 03/02/2009

CLINICAL DATA: Acute back pain status post lifting a couch.

EXAM:
LUMBAR SPINE - 2-3 VIEW

[Series 1: dxr lumbar spine ap and lateral · 0.14mm/px · 3 of 3 slices shown]
[im 1/3]
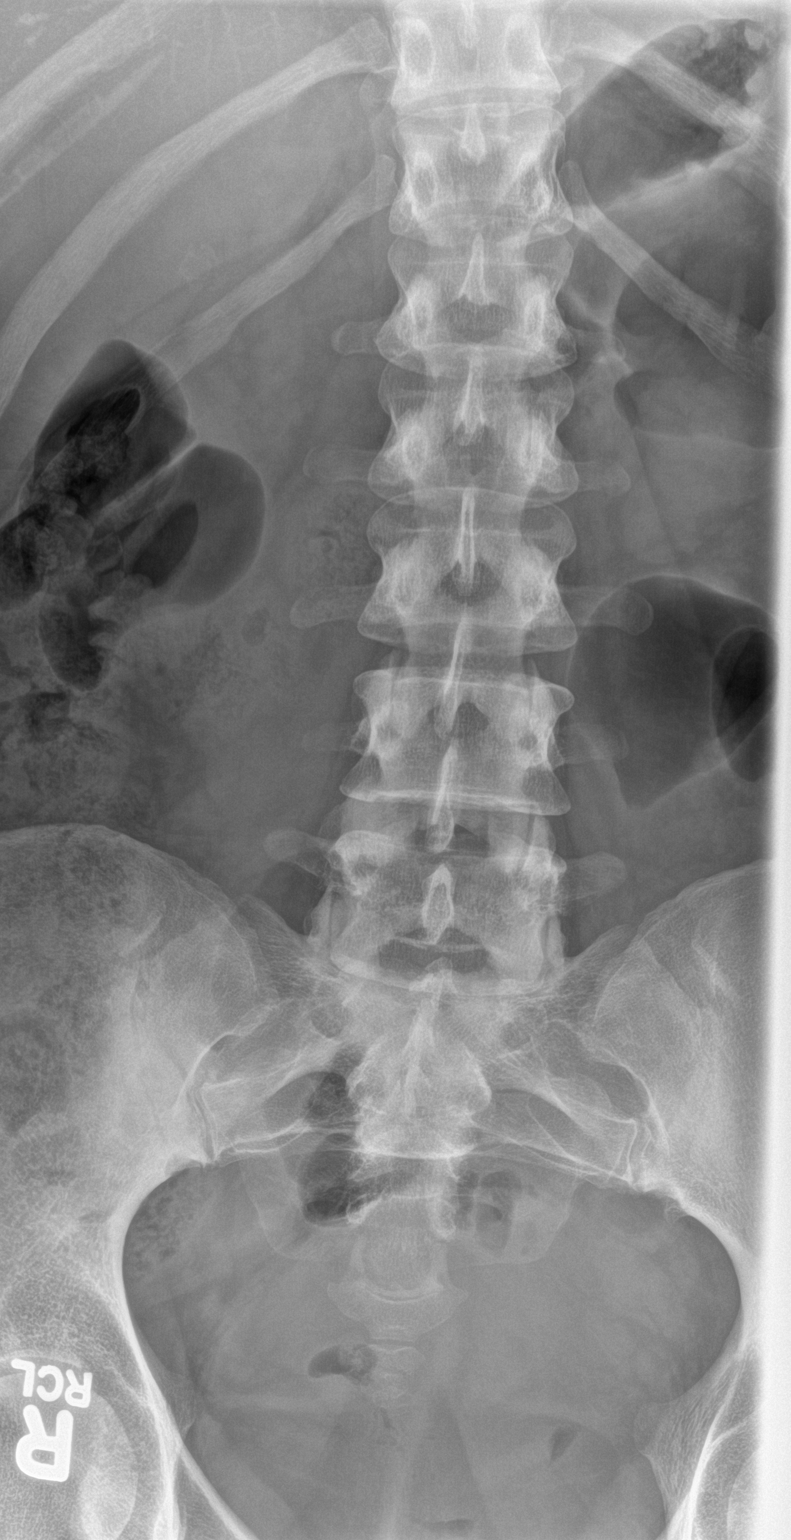
[im 2/3]
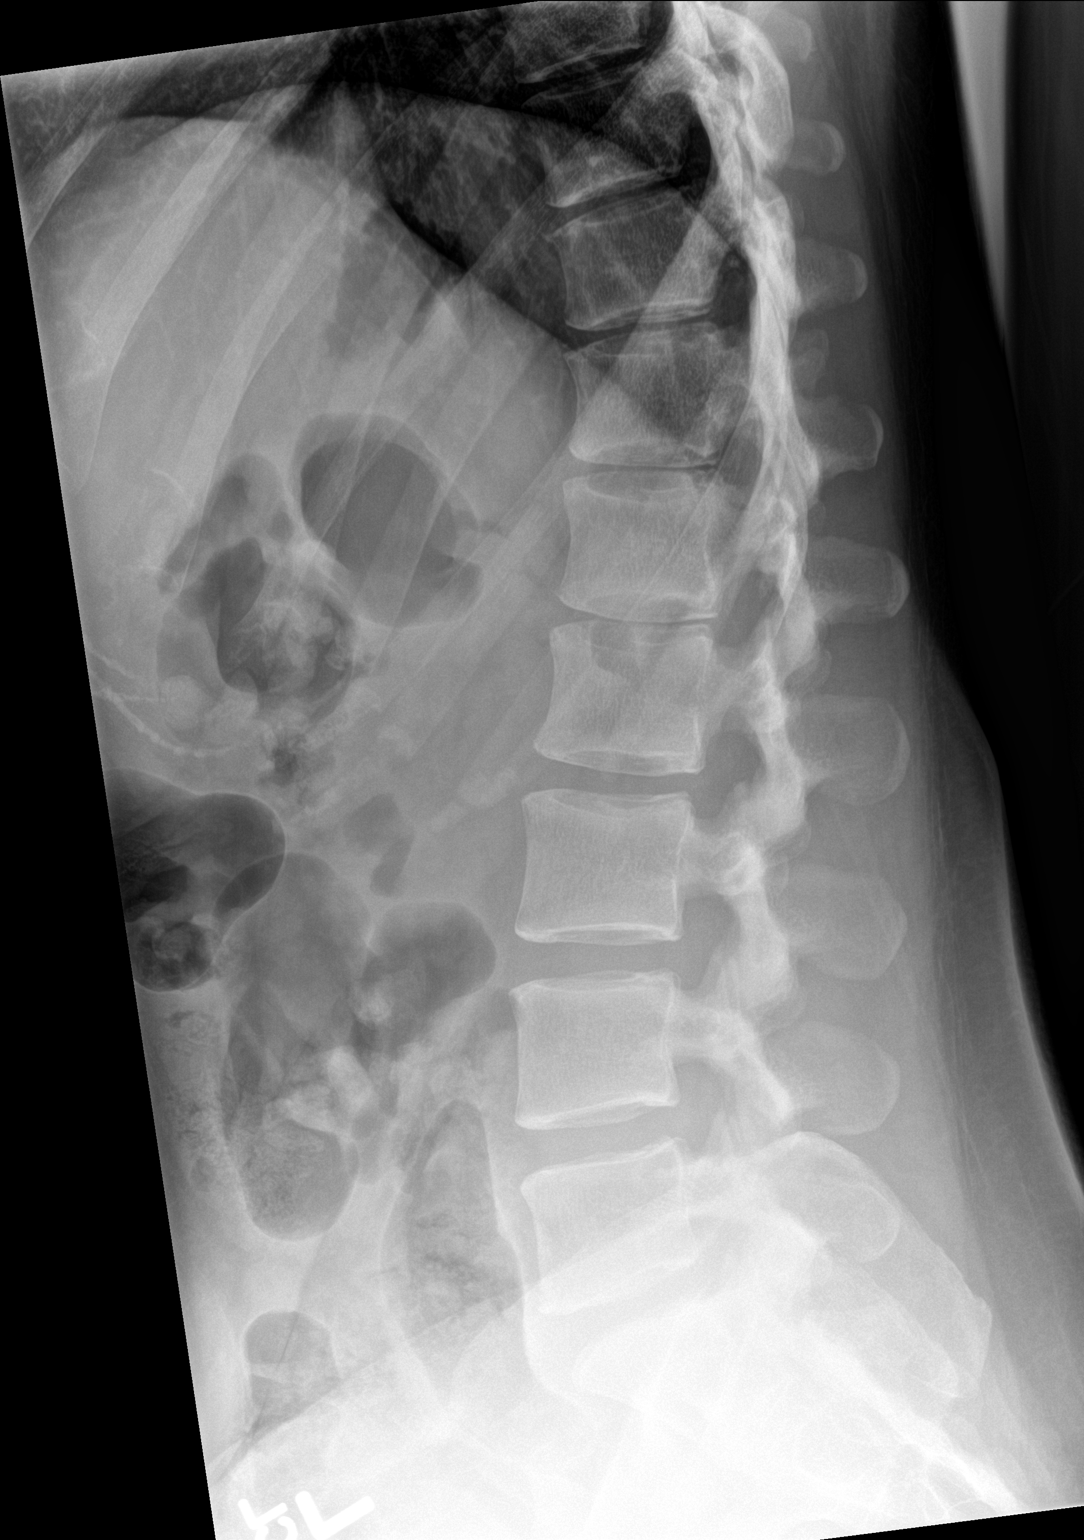
[im 3/3]
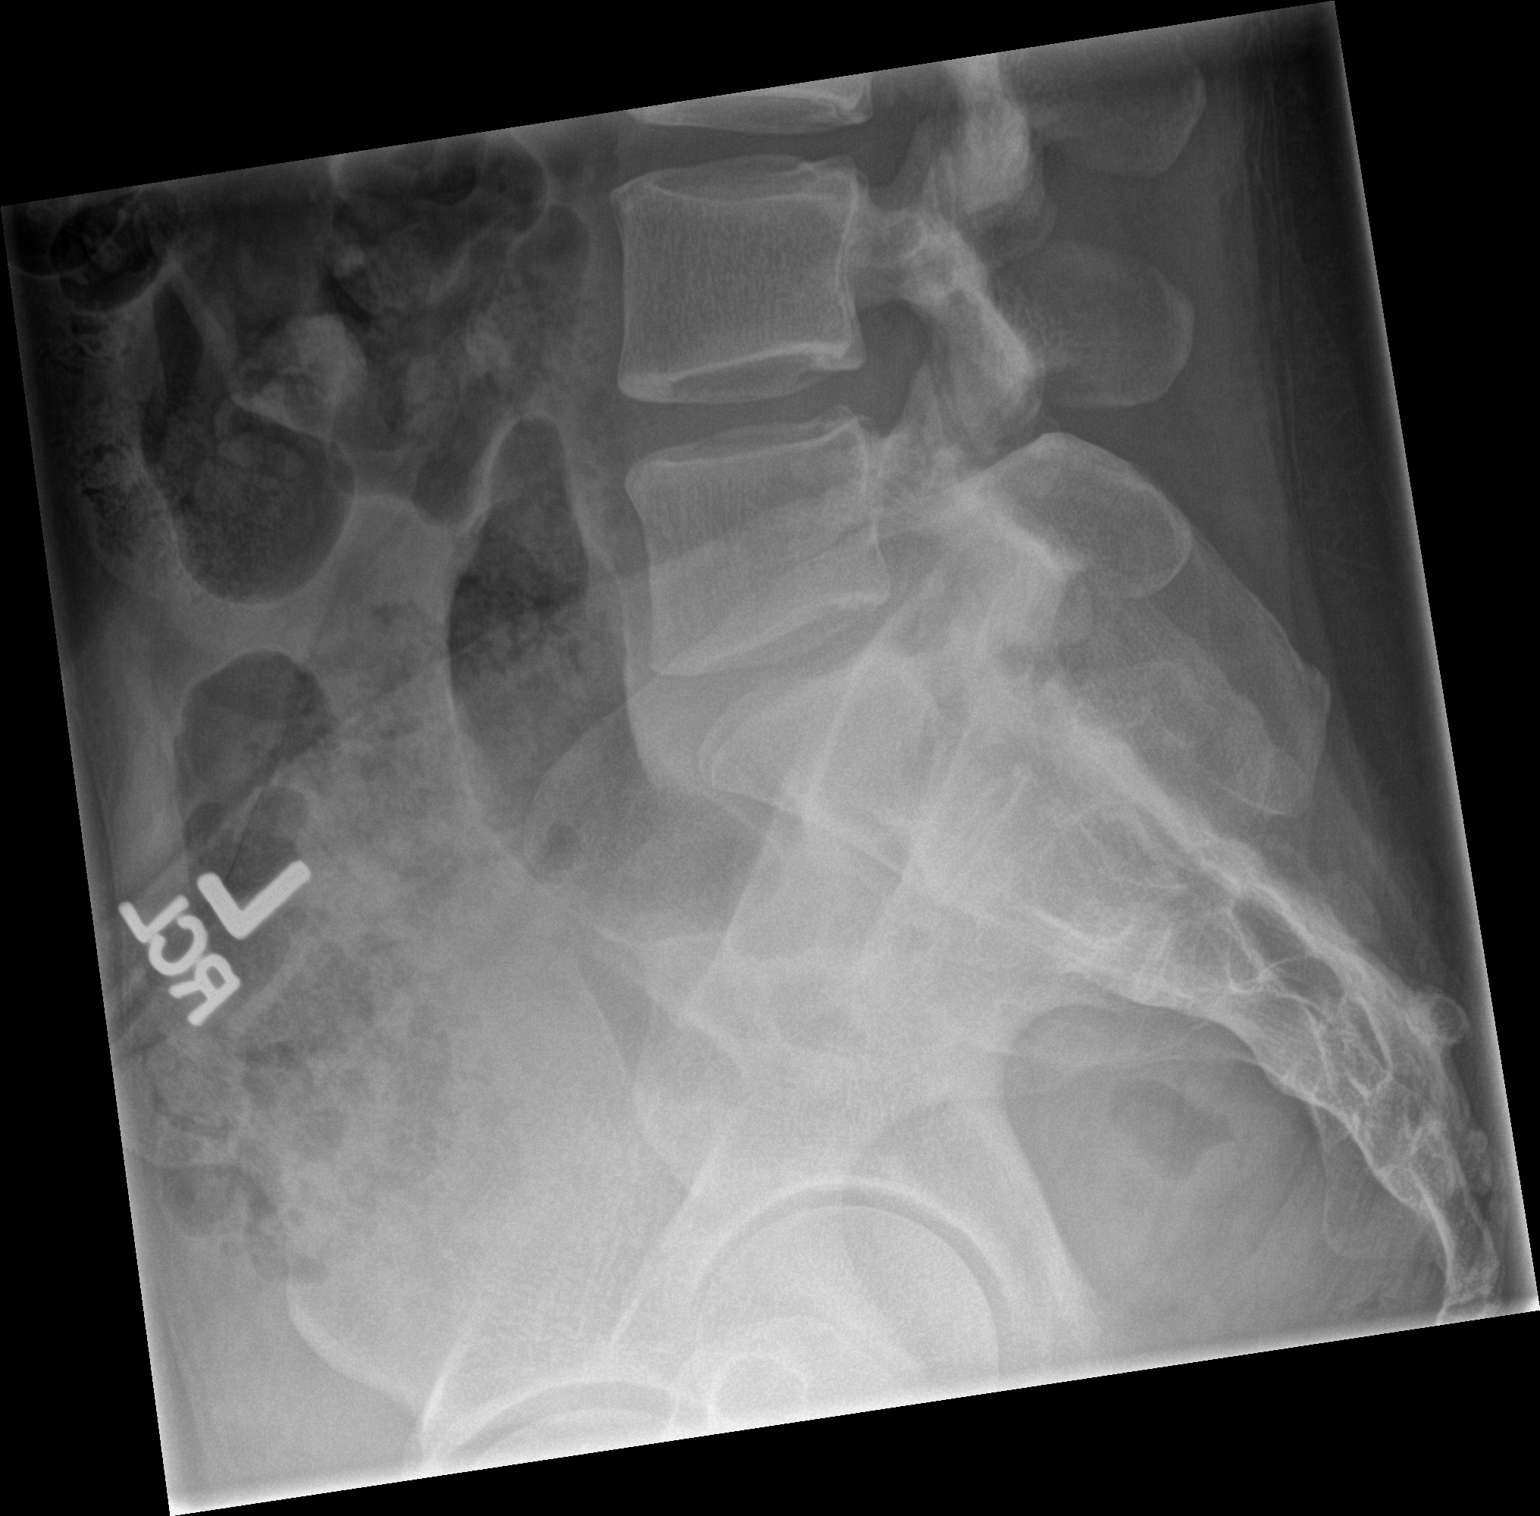

[3 of 3 positions shown; findings below may reference images not displayed]

FINDINGS: Normal anatomic alignment. Preservation of the intervertebral disc
space heights. Minimal anterior height loss of the T11 and T12
vertebral bodies. No static listhesis.
IMPRESSION: Minimal anterior height loss of the T11 and T12 vertebral bodies,
age indeterminate. Recommend correlation with site of pain.
